# Patient Record
Sex: Female | Born: 1985
Health system: Southern US, Community
[De-identification: ages and names within clinical notes are randomized; demographics above are authoritative.]

## PROBLEM LIST (undated history)

## (undated) DIAGNOSIS — N390 Urinary tract infection, site not specified: Secondary | ICD-10-CM

## (undated) DIAGNOSIS — F410 Panic disorder [episodic paroxysmal anxiety] without agoraphobia: Secondary | ICD-10-CM

## (undated) DIAGNOSIS — N200 Calculus of kidney: Secondary | ICD-10-CM

## (undated) HISTORY — PX: KIDNEY STONE SURGERY: SHX686

---

## 2011-08-27 ENCOUNTER — Emergency Department (INDEPENDENT_AMBULATORY_CARE_PROVIDER_SITE_OTHER): Payer: Self-pay

## 2011-08-27 ENCOUNTER — Emergency Department (HOSPITAL_BASED_OUTPATIENT_CLINIC_OR_DEPARTMENT_OTHER)
Admission: EM | Admit: 2011-08-27 | Discharge: 2011-08-27 | Disposition: A | Discharge status: Home / Self Care | Payer: Self-pay | Attending: Emergency Medicine | Admitting: Emergency Medicine

## 2011-08-27 DIAGNOSIS — M79609 Pain in unspecified limb: Secondary | ICD-10-CM

## 2011-08-27 DIAGNOSIS — M7989 Other specified soft tissue disorders: Secondary | ICD-10-CM

## 2011-08-27 DIAGNOSIS — S60229A Contusion of unspecified hand, initial encounter: Secondary | ICD-10-CM | POA: Insufficient documentation

## 2011-08-27 DIAGNOSIS — X58XXXA Exposure to other specified factors, initial encounter: Secondary | ICD-10-CM

## 2011-08-27 DIAGNOSIS — W230XXA Caught, crushed, jammed, or pinched between moving objects, initial encounter: Secondary | ICD-10-CM | POA: Insufficient documentation

## 2011-08-27 DIAGNOSIS — F17200 Nicotine dependence, unspecified, uncomplicated: Secondary | ICD-10-CM | POA: Insufficient documentation

## 2011-08-27 HISTORY — DX: Calculus of kidney: N20.0

## 2011-08-27 MED ORDER — IBUPROFEN 400 MG PO TABS
600.0000 mg | ORAL_TABLET | Freq: Once | ORAL | Status: AC
  Administered 2011-08-27: 600 mg via ORAL
  Filled 2011-08-27: qty 1

## 2011-08-27 MED ORDER — HYDROCODONE-ACETAMINOPHEN 5-500 MG PO TABS: 1.0000 | ORAL_TABLET | Freq: Four times a day (QID) | ORAL | Status: AC | PRN

## 2011-08-27 MED ORDER — IBUPROFEN 600 MG PO TABS: 600.0000 mg | ORAL_TABLET | Freq: Four times a day (QID) | ORAL | Status: AC | PRN

## 2011-08-27 NOTE — ED Notes (Signed)
Injured right hand in car door 10am

## 2011-08-27 NOTE — ED Provider Notes (Signed)
History     CSN: [id number] Arrival date & time: 08/27/2011  4:28 PM   First MD Initiated Contact with Patient 08/27/11 1632      Chief Complaint  Patient presents with  . Hand Injury     HPI  30 old female previously healthy presents with right hand pain. Patient states that this morning she actually slammed her hand in a car door. She is complaining of pain at the days of the right thumb the thenar eminence. She denies any wrist pain. She denies any numbness tingling or weakness of her fingers. She took ibuprofen earlier today without relief of the pain. She states that the pain is 7/10 at this time. She states that she feels safe at home.    Past Medical History  Diagnosis Date  . Kidney stone     Past Surgical History  Procedure Date  . Kidney stone surgery     No family history on file.  History  Substance Use Topics  . Smoking status: Current Everyday Smoker  . Smokeless tobacco: Not on file  . Alcohol Use:     OB History    Grav Para Term Preterm Abortions TAB SAB Ect Mult Living                  Review of Systems  All other systems reviewed and are negative.    except as noted HPI   Allergies  Review of patient's allergies indicates not on file.  Home Medications   Current Outpatient Rx  Name Route Sig Dispense Refill  . IBUPROFEN 200 MG PO TABS Oral Take 400 mg by mouth every 6 (six) hours as needed. For pain     . HYDROCODONE-ACETAMINOPHEN 5-500 MG PO TABS Oral Take 1-2 tablets by mouth every 6 (six) hours as needed for pain. 15 tablet 0  . IBUPROFEN 600 MG PO TABS Oral Take 1 tablet (600 mg total) by mouth every 6 (six) hours as needed for pain. 30 tablet 0    BP 105/62  Pulse 66  Temp(Src) 98.4 F (36.9 C) (Oral)  Resp 20  Ht 5\' 6"  (1.676 m)  Wt 110 lb (49.896 kg)  BMI 17.75 kg/m2  SpO2 100%  LMP 08/05/2011  Physical Exam  Nursing note and vitals reviewed. Constitutional: She is oriented to person, place, and time. She appears  well-developed.  HENT:  Head: Atraumatic.  Mouth/Throat: Oropharynx is clear and moist.  Eyes: Conjunctivae and EOM are normal. Pupils are equal, round, and reactive to light.  Neck: Normal range of motion. Neck supple.  Cardiovascular: Normal rate, regular rhythm, normal heart sounds and intact distal pulses.   Pulmonary/Chest: Effort normal and breath sounds normal. No respiratory distress. She has no wheezes. She has no rales.  Abdominal: Soft. She exhibits no distension. There is no tenderness. There is no rebound and no guarding.  Musculoskeletal: Normal range of motion.       Right hand with minimal swelling and diffuse tenderness to palpation over thenar eminence there is minimal tenderness to palpation of her MCP of the first digit. Full range of motion without difficulty. Good thumb opposition. Grip strength 5 out of 5. Peyton Najjar refill less than 2 seconds. Gross sensation intact throughout. There is no bony tenderness to palpation of her wrist specifically no snuffbox tenderness.  Neurological: She is alert and oriented to person, place, and time.  Skin: Skin is warm and dry. No rash noted.  Psychiatric: She has a normal mood and affect.  ED Course  Procedures (including critical care time)  Labs Reviewed - No data to display Dg Hand Complete Right  08/27/2011  *RADIOLOGY REPORT*  Clinical Data: Pain and swelling at base of thumb.  RIGHT HAND - COMPLETE 3+ VIEW  Comparison: None.  Findings: No acute osseous or joint abnormality.  IMPRESSION: No acute osseous or joint abnormality.  Original Report Authenticated By: Reyes Ivan, M.D.     1. Hand contusion       MDM  Contusion of hand. Ibuprofen here. Ibuprofen for home, vicodin for breakthrough pain. F/U PMD as needed.  Stefano Gaul, MD         Forbes Cellar, MD 08/27/11 [phone]

## 2011-12-27 ENCOUNTER — Emergency Department (HOSPITAL_BASED_OUTPATIENT_CLINIC_OR_DEPARTMENT_OTHER): Payer: Self-pay

## 2011-12-27 ENCOUNTER — Emergency Department (INDEPENDENT_AMBULATORY_CARE_PROVIDER_SITE_OTHER): Payer: Self-pay

## 2011-12-27 ENCOUNTER — Encounter (HOSPITAL_BASED_OUTPATIENT_CLINIC_OR_DEPARTMENT_OTHER): Payer: Self-pay | Admitting: *Deleted

## 2011-12-27 ENCOUNTER — Emergency Department (HOSPITAL_BASED_OUTPATIENT_CLINIC_OR_DEPARTMENT_OTHER)
Admission: EM | Admit: 2011-12-27 | Discharge: 2011-12-27 | Disposition: A | Discharge status: Home / Self Care | Payer: Self-pay | Attending: Emergency Medicine | Admitting: Emergency Medicine

## 2011-12-27 DIAGNOSIS — R109 Unspecified abdominal pain: Secondary | ICD-10-CM

## 2011-12-27 DIAGNOSIS — N1330 Unspecified hydronephrosis: Secondary | ICD-10-CM

## 2011-12-27 DIAGNOSIS — M549 Dorsalgia, unspecified: Secondary | ICD-10-CM | POA: Insufficient documentation

## 2011-12-27 DIAGNOSIS — N200 Calculus of kidney: Secondary | ICD-10-CM | POA: Insufficient documentation

## 2011-12-27 DIAGNOSIS — R319 Hematuria, unspecified: Secondary | ICD-10-CM | POA: Insufficient documentation

## 2011-12-27 DIAGNOSIS — N201 Calculus of ureter: Secondary | ICD-10-CM

## 2011-12-27 HISTORY — DX: Panic disorder (episodic paroxysmal anxiety): F41.0

## 2011-12-27 LAB — URINALYSIS, ROUTINE W REFLEX MICROSCOPIC
Nitrite: NEGATIVE
Specific Gravity, Urine: 1.018 (ref 1.005–1.030)
pH: 5 (ref 5.0–8.0)

## 2011-12-27 LAB — PREGNANCY, URINE: Preg Test, Ur: NEGATIVE

## 2011-12-27 LAB — URINE MICROSCOPIC-ADD ON

## 2011-12-27 MED ORDER — OXYCODONE-ACETAMINOPHEN 7.5-325 MG PO TABS: 1.0000 | ORAL_TABLET | ORAL | Status: AC | PRN

## 2011-12-27 MED ORDER — KETOROLAC TROMETHAMINE 30 MG/ML IJ SOLN
30.0000 mg | Freq: Once | INTRAMUSCULAR | Status: AC
  Administered 2011-12-27: 30 mg via INTRAVENOUS
  Filled 2011-12-27: qty 1

## 2011-12-27 MED ORDER — IBUPROFEN 600 MG PO TABS: 600.0000 mg | ORAL_TABLET | Freq: Four times a day (QID) | ORAL | Status: AC | PRN

## 2011-12-27 MED ORDER — ONDANSETRON HCL 4 MG/2ML IJ SOLN
4.0000 mg | Freq: Once | INTRAMUSCULAR | Status: AC
  Administered 2011-12-27: 4 mg via INTRAVENOUS
  Filled 2011-12-27: qty 2

## 2011-12-27 NOTE — ED Notes (Signed)
This am had sudden onset of sharp pain in her left flank. Last time she has the same pain she had a kidney stone. Nausea.

## 2011-12-27 NOTE — ED Provider Notes (Signed)
History     CSN: [id number]  Arrival date & time 12/27/11  1515   First MD Initiated Contact with Patient 12/27/11 1551      Chief Complaint  Patient presents with  . Back Pain    (Consider location/radiation/quality/duration/timing/severity/associated sxs/prior treatment) HPI  Patient is 26 yo lady with PMH of left kidney stone and panic attack disorder, who presents with left flanl pain.   Per patient, she started having sudden onset left flank pain at 9:30 AM. It is 9/10 in severity, sharp, non-radiating. It is associated with nausea and vomiting. She vomited twice clear liquid without blood in it.  It is aggravated by standing up or walking, and alleviated by laying still on the bed or ice pack.   Patient reports having hematuria which is brownish and dark red urine.  Her LMP was one week ago.   She denies fever, chills, UTI symptoms, abdominal pain, cough, chest pain, SOB,  diarrhea, constipation, joint pain or leg swelling.   Past Medical History  Diagnosis Date  . Kidney stone   . Panic attacks     Past Surgical History  Procedure Date  . Kidney stone surgery     No family history on file.  History  Substance Use Topics  . Smoking status: Current Everyday Smoker  . Smokeless tobacco: Not on file  . Alcohol Use: Yes    OB History    Grav Para Term Preterm Abortions TAB SAB Ect Mult Living                  Review of Systems  General: no fevers, chills, no changes in body weight, no changes in appetite Skin: no rash HEENT: no blurry vision, hearing changes or sore throat Pulm: no dyspnea, coughing, wheezing CV: no chest pain, palpitations, shortness of breath Abd: no nausea/vomiting, abdominal pain, diarrhea/constipation. Has left flank pain. GU: no dysuria, hematuria, polyuria. Has hematuria. Ext: no arthralgias, myalgias Neuro: no weakness, numbness, or tingling  Allergies  Review of patient's allergies indicates no known allergies.  Home  Medications   Current Outpatient Rx  Name Route Sig Dispense Refill  . MIRTAZAPINE PO Oral Take by mouth.    Marland Kitchen NAPROXEN SODIUM 220 MG PO TABS Oral Take 220 mg by mouth 2 (two) times daily with a meal. Patient used this medication for pain.    . IBUPROFEN 200 MG PO TABS Oral Take 400 mg by mouth every 6 (six) hours as needed. For pain       BP 106/68  Pulse 75  Temp(Src) 98 F (36.7 C) (Oral)  Resp 20  SpO2 100%  Physical Exam  general:  not in acute distress, but in pain. HEENT: PERRL, EOMI, no scleral icterus Cardiac: S1/S2, RRR, No murmurs, gallops or rubs Pulm: Good air movement bilaterally, Clear to auscultation bilaterally, No rales, wheezing, rhonchi or rubs. Abd: Soft,  nondistended, nontender, no rebound pain, no organomegaly, BS present. Positive for left CVA tenderness, and also tender over the left flank area which is at location slightly lower than CVA. Ext: No rashes or edema, 2+DP/PT pulse bilaterally Neuro: alert and oriented X3, cranial nerves II-XII grossly intact, muscle strength 5/5 in all extremeties,  sensation to light touch intact.    ED Course  Procedures (including critical care time)  Patient's Nausea, Vomiting and abdominal pain is most likely caused by kidney stone. Other differential diagnosis includes rapture or twisted ovarian cyst, ectopic pregnancy.  Less likely to be appendicitis (no fever, no  abdominal pain), or diverticulitis (she is young and no abdominal pain).  Patient's pregnancy test is negative. Her CT scan showed 5 mm ureteral calculus. Patient feels better after received Toradol injection. She will be discharged home on percocet and ibuprofen, and is instructed to follow up with urologist.     Labs Reviewed  URINALYSIS, ROUTINE W REFLEX MICROSCOPIC - Abnormal; Notable for the following:    Color, Urine RED (*) BIOCHEMICALS MAY BE AFFECTED BY COLOR   APPearance CLOUDY (*)    Hgb urine dipstick LARGE (*)    Bilirubin Urine MODERATE  (*)    Ketones, ur 15 (*)    Protein, ur 100 (*)    Leukocytes, UA MODERATE (*)    All other components within normal limits  URINE MICROSCOPIC-ADD ON - Abnormal; Notable for the following:    Squamous Epithelial / LPF FEW (*)    Bacteria, UA FEW (*)    All other components within normal limits  PREGNANCY, URINE   No results found.   No diagnosis found.    MDM         Lorretta Harp, MD 12/27/11 Ernestina [place]

## 2011-12-27 NOTE — Discharge Instructions (Signed)
Kidney Stones Kidney stones (ureteral lithiasis) are solid masses that form inside your kidneys. The intense pain is caused by the stone moving through the kidney, ureter, bladder, and urethra (urinary tract). When the stone moves, the ureter starts to spasm around the stone. The stone is usually passed in the urine.  HOME CARE  Drink enough fluids to keep your pee (urine) clear or pale yellow. This helps to get the stone out.   Strain all pee through the provided strainer. Do not pee without peeing through the strainer, not even once. If you pee the stone out, catch it. The stone may be as small as a grain of salt. Take this to your doctor.   Only take medicine as told by your doctor.   Follow up with your doctor as told.   Get follow-up X-rays as told by your doctor.  GET HELP RIGHT AWAY IF:   Your pain does not get better with medicine.   You have a fever.   Your pain increases and gets worse over 18 hours.   You have new belly (abdominal) pain.   You feel faint or pass out.  MAKE SURE YOU:   Understand these instructions.   Will watch your condition.   Will get help right away if you are not doing well or get worse.  Document Released: 03/08/2008 Document Revised: 09/09/2011 Document Reviewed: 07/18/2009 [employer] Patient Information 2012 [employer]. 

## 2011-12-28 NOTE — ED Provider Notes (Signed)
I saw and evaluated the patient, reviewed the resident's note and I agree with the findings and plan.   .Face to face Exam:  General:  Awake HEENT:  Atraumatic Resp:  Normal effort Abd:  Nondistended Neuro:No focal weakness Lymph: No adenopathy   Nelia Shi, MD 12/28/11 1242

## 2012-05-11 ENCOUNTER — Encounter (HOSPITAL_BASED_OUTPATIENT_CLINIC_OR_DEPARTMENT_OTHER): Payer: Self-pay

## 2012-05-11 ENCOUNTER — Emergency Department (HOSPITAL_BASED_OUTPATIENT_CLINIC_OR_DEPARTMENT_OTHER)
Admission: EM | Admit: 2012-05-11 | Discharge: 2012-05-11 | Disposition: A | Discharge status: Home / Self Care | Payer: Self-pay | Attending: Emergency Medicine | Admitting: Emergency Medicine

## 2012-05-11 DIAGNOSIS — N760 Acute vaginitis: Secondary | ICD-10-CM | POA: Insufficient documentation

## 2012-05-11 DIAGNOSIS — A499 Bacterial infection, unspecified: Secondary | ICD-10-CM | POA: Insufficient documentation

## 2012-05-11 DIAGNOSIS — N72 Inflammatory disease of cervix uteri: Secondary | ICD-10-CM | POA: Insufficient documentation

## 2012-05-11 DIAGNOSIS — N390 Urinary tract infection, site not specified: Secondary | ICD-10-CM | POA: Insufficient documentation

## 2012-05-11 DIAGNOSIS — Z87891 Personal history of nicotine dependence: Secondary | ICD-10-CM | POA: Insufficient documentation

## 2012-05-11 DIAGNOSIS — B9689 Other specified bacterial agents as the cause of diseases classified elsewhere: Secondary | ICD-10-CM | POA: Insufficient documentation

## 2012-05-11 LAB — URINE MICROSCOPIC-ADD ON

## 2012-05-11 LAB — URINALYSIS, ROUTINE W REFLEX MICROSCOPIC
Bilirubin Urine: NEGATIVE
Protein, ur: NEGATIVE mg/dL
Specific Gravity, Urine: 1.024 (ref 1.005–1.030)
Urobilinogen, UA: 0.2 mg/dL (ref 0.0–1.0)

## 2012-05-11 LAB — WET PREP, GENITAL

## 2012-05-11 MED ORDER — METRONIDAZOLE 500 MG PO TABS: 500.0000 mg | ORAL_TABLET | Freq: Two times a day (BID) | ORAL | Status: AC

## 2012-05-11 MED ORDER — AZITHROMYCIN 250 MG PO TABS
1000.0000 mg | ORAL_TABLET | Freq: Once | ORAL | Status: AC
  Administered 2012-05-11: 1000 mg via ORAL
  Filled 2012-05-11: qty 4

## 2012-05-11 MED ORDER — CEFTRIAXONE SODIUM 250 MG IJ SOLR
250.0000 mg | Freq: Once | INTRAMUSCULAR | Status: AC
  Administered 2012-05-11: 250 mg via INTRAMUSCULAR
  Filled 2012-05-11: qty 250

## 2012-05-11 MED ORDER — LIDOCAINE HCL (PF) 1 % IJ SOLN
INTRAMUSCULAR | Status: AC
  Administered 2012-05-11: 2.1 mL
  Filled 2012-05-11: qty 5

## 2012-05-11 MED ORDER — CEPHALEXIN 500 MG PO CAPS: 500.0000 mg | ORAL_CAPSULE | Freq: Four times a day (QID) | ORAL | Status: AC

## 2012-05-11 NOTE — ED Notes (Signed)
Dysuria x 2 days.

## 2012-05-11 NOTE — ED Notes (Signed)
assissted with pelvic done by PA C in ED

## 2012-05-11 NOTE — ED Provider Notes (Signed)
History     CSN: [id number]  Arrival date & time 05/11/12  4098   First MD Initiated Contact with Patient 05/11/12 1934      Chief Complaint  Patient presents with  . Dysuria    (Consider location/radiation/quality/duration/timing/severity/associated sxs/prior treatment) Patient is a 26 y.o. female presenting with dysuria. The history is provided by the patient.  Dysuria  This is a new problem. The current episode started 2 days ago. The problem occurs every urination. The problem has been gradually worsening. The quality of the pain is described as burning. The pain is mild. There has been no fever. Associated symptoms include frequency, hesitancy and urgency. Pertinent negatives include no chills, no sweats, no nausea, no vomiting, no discharge, no hematuria, no possible pregnancy and no flank pain. She has tried increased fluids for the symptoms. Her past medical history is significant for kidney stones and recurrent UTIs.  Pt states hx of UTIs, feels the same. Denies flank pain, hematuria, fever, chills, nausea, vomiting. Took cranberry pills and fluids, no relief. No vaginal pain, discharge, bleeding. No abdominal pain.   Past Medical History  Diagnosis Date  . Kidney stone   . Panic attacks     Past Surgical History  Procedure Date  . Kidney stone surgery     No family history on file.  History  Substance Use Topics  . Smoking status: Former Games developer  . Smokeless tobacco: Not on file  . Alcohol Use: Yes    OB History    Grav Para Term Preterm Abortions TAB SAB Ect Mult Living                  Review of Systems  Constitutional: Negative for fever and chills.  Respiratory: Negative.   Cardiovascular: Negative.   Gastrointestinal: Negative for nausea, vomiting and abdominal pain.  Genitourinary: Positive for dysuria, hesitancy, urgency and frequency. Negative for hematuria, flank pain, vaginal bleeding, vaginal discharge and pelvic pain.  Musculoskeletal: Negative  for back pain.  Skin: Negative.   Neurological: Negative for dizziness, numbness and headaches.    Allergies  Review of patient's allergies indicates no known allergies.  Home Medications   Current Outpatient Rx  Name Route Sig Dispense Refill  . IBUPROFEN 200 MG PO TABS Oral Take 400 mg by mouth every 6 (six) hours as needed. For pain     . MIRTAZAPINE PO Oral Take by mouth.    Marland Kitchen NAPROXEN SODIUM 220 MG PO TABS Oral Take 220 mg by mouth 2 (two) times daily with a meal. Patient used this medication for pain.      BP 123/78  Pulse 79  Temp 98.4 F (36.9 C) (Oral)  Resp 16  Ht 5\' 5"  (1.651 m)  Wt 114 lb (51.71 kg)  BMI 18.97 kg/m2  SpO2 100%  LMP 04/17/2012  Physical Exam  Nursing note and vitals reviewed. Constitutional: She is oriented to person, place, and time. She appears well-developed and well-nourished. No distress.  Eyes: Pupils are equal, round, and reactive to light.  Neck: Neck supple.  Cardiovascular: Normal rate, regular rhythm and normal heart sounds.   Pulmonary/Chest: Effort normal and breath sounds normal. No respiratory distress. She has no wheezes. She has no rales.  Abdominal: Soft. Bowel sounds are normal. She exhibits no distension. There is no tenderness. There is no rebound and no guarding.       No CVA tenderness  Genitourinary: Uterus normal. Vaginal discharge found.       White, thick vaginal  discharge. Cervix friable. No CMT, no adnexal tenderness  Neurological: She is alert and oriented to person, place, and time.  Skin: Skin is warm and dry.  Psychiatric: She has a normal mood and affect.    ED Course  Procedures (including critical care time)  Symptoms concerning for UTI, hx of the same. No flank pain, vomiting, hematuria, doubt kidney stone or pyelonephritis. Afebrile. Non toxic. UA pending.   Results for orders placed during the hospital encounter of 05/11/12  URINALYSIS, ROUTINE W REFLEX MICROSCOPIC      Component Value Range    Color, Urine YELLOW  YELLOW   APPearance CLOUDY (*) CLEAR   Specific Gravity, Urine 1.024  1.005 - 1.030   pH 5.5  5.0 - 8.0   Glucose, UA NEGATIVE  NEGATIVE mg/dL   Hgb urine dipstick SMALL (*) NEGATIVE   Bilirubin Urine NEGATIVE  NEGATIVE   Ketones, ur NEGATIVE  NEGATIVE mg/dL   Protein, ur NEGATIVE  NEGATIVE mg/dL   Urobilinogen, UA 0.2  0.0 - 1.0 mg/dL   Nitrite NEGATIVE  NEGATIVE   Leukocytes, UA TRACE (*) NEGATIVE  PREGNANCY, URINE      Component Value Range   Preg Test, Ur NEGATIVE  NEGATIVE  URINE MICROSCOPIC-ADD ON      Component Value Range   Squamous Epithelial / LPF MANY (*) RARE   WBC, UA 3-6  <3 WBC/hpf   RBC / HPF 3-6  <3 RBC/hpf   Bacteria, UA MANY (*) RARE   Urine-Other MUCOUS PRESENT    WET PREP, GENITAL      Component Value Range   Yeast Wet Prep HPF POC NONE SEEN  NONE SEEN   Trich, Wet Prep NONE SEEN  NONE SEEN   Clue Cells Wet Prep HPF POC MANY (*) NONE SEEN   WBC, Wet Prep HPF POC TOO NUMEROUS TO COUNT (*) NONE SEEN    9:19 PM Pt's wet prep showed TNTC WBCs on wet prep, many clue cells. Pt has no uterine tenderness, afebrile, doubt PID. Treated for cervicitis with rocephin 250mg  IM, zithromax 1g PO. Will treat at home for UTI with keflex, bacterial vaginosis with flagyl. Discussed results with pt, possibitlit of STD. Advised to avoid intercourse for a week. Cultures pending. Get partner treated if positive. Pt voiced understanding.    No results found.   1. Cervicitis   2. Bacterial vaginosis   3. UTI (lower urinary tract infection)       MDM          Lottie Mussel, PA 05/11/12 2230

## 2012-05-12 LAB — URINE CULTURE
Colony Count: NO GROWTH
Culture: NO GROWTH

## 2012-05-12 NOTE — ED Provider Notes (Signed)
Medical screening examination/treatment/procedure(s) were performed by non-physician practitioner and as supervising physician I was immediately available for consultation/collaboration.    Vida Roller, MD 05/12/12 [phone]

## 2012-09-19 ENCOUNTER — Encounter (HOSPITAL_BASED_OUTPATIENT_CLINIC_OR_DEPARTMENT_OTHER): Payer: Self-pay | Admitting: *Deleted

## 2012-09-19 ENCOUNTER — Emergency Department (HOSPITAL_BASED_OUTPATIENT_CLINIC_OR_DEPARTMENT_OTHER)
Admission: EM | Admit: 2012-09-19 | Discharge: 2012-09-19 | Disposition: A | Discharge status: Home / Self Care | Payer: Medicaid Other | Attending: Emergency Medicine | Admitting: Emergency Medicine

## 2012-09-19 DIAGNOSIS — F410 Panic disorder [episodic paroxysmal anxiety] without agoraphobia: Secondary | ICD-10-CM | POA: Insufficient documentation

## 2012-09-19 DIAGNOSIS — Z79899 Other long term (current) drug therapy: Secondary | ICD-10-CM | POA: Insufficient documentation

## 2012-09-19 DIAGNOSIS — K047 Periapical abscess without sinus: Secondary | ICD-10-CM | POA: Insufficient documentation

## 2012-09-19 DIAGNOSIS — Z87891 Personal history of nicotine dependence: Secondary | ICD-10-CM | POA: Insufficient documentation

## 2012-09-19 DIAGNOSIS — Z87442 Personal history of urinary calculi: Secondary | ICD-10-CM | POA: Insufficient documentation

## 2012-09-19 MED ORDER — OXYCODONE-ACETAMINOPHEN 5-325 MG PO TABS: 2.0000 | ORAL_TABLET | ORAL | Status: DC | PRN

## 2012-09-19 MED ORDER — PENICILLIN V POTASSIUM 500 MG PO TABS: 500.0000 mg | ORAL_TABLET | Freq: Four times a day (QID) | ORAL | Status: AC

## 2012-09-19 NOTE — ED Notes (Signed)
Patient states she woke up this morning with pain and swelling in her right lower jaw.  Pt has a broken tooth and dental caries.

## 2012-09-19 NOTE — ED Notes (Signed)
Care assumed

## 2012-09-19 NOTE — ED Provider Notes (Signed)
History     CSN: [id number]  Arrival date & time 09/19/12  1250   First MD Initiated Contact with Patient 09/19/12 1317      Chief Complaint  Patient presents with  . Dental Pain    (Consider location/radiation/quality/duration/timing/severity/associated sxs/prior treatment) HPI Comments:  patient presents with right-sided dental pain for the past day. She has a history of a broken tooth but states it only became painful over the past 24 hours.  She denies any fevers, vomiting, difficulty breathing or swallowing. 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  The history is provided by the patient.    Past Medical History  Diagnosis Date  . Kidney stone   . Panic attacks     Past Surgical History  Procedure Date  . Kidney stone surgery     No family history on file.  History  Substance Use Topics  . Smoking status: Former Games developer  . Smokeless tobacco: Not on file  . Alcohol Use: Yes    OB History    Grav Para Term Preterm Abortions TAB SAB Ect Mult Living                  Review of Systems  Unable to perform ROS Constitutional: Negative for fever.  HENT: Positive for dental problem. Negative for sore throat and trouble swallowing.   Respiratory: Negative for cough and shortness of breath.   Gastrointestinal: Negative for nausea, vomiting and abdominal pain.  Genitourinary: Negative for dysuria.  Neurological: Negative for headaches.  Review of Systems  Unable to perform ROS Constitutional: Negative for fever.  HENT: Positive for dental problem. Negative for sore throat and trouble swallowing.   Respiratory: Negative for cough and shortness of breath.   Gastrointestinal: Negative for nausea, vomiting and abdominal pain.  Genitourinary: Negative for dysuria.  Neurological: Negative for headaches.  A complete 10 system review of systems was obtained and all systems are negative except as noted in the HPI and PMH.     Allergies  Review of patient's  allergies indicates no known allergies.  Home Medications   Current Outpatient Rx  Name  Route  Sig  Dispense  Refill  . MIRTAZAPINE 15 MG PO TABS   Oral   Take 15 mg by mouth at bedtime.           BP 130/91  Pulse 70  Temp 98.6 F (37 C) (Oral)  Resp 20  Ht 5\' 6"  (1.676 m)  Wt 112 lb (50.803 kg)  BMI 18.08 kg/m2  SpO2 100%  LMP 09/12/2012  Physical Exam  Constitutional: She is oriented to person, place, and time. She appears well-developed and well-nourished. No distress.       Mild R sided jaw swelling  HENT:  Head: Normocephalic.  Mouth/Throat: Oropharynx is clear and moist.    Eyes: Conjunctivae normal and EOM are normal. Pupils are equal, round, and reactive to light.  Neck: Normal range of motion. Neck supple.       No meningismus  Cardiovascular: Normal rate, regular rhythm and normal heart sounds.   No murmur heard. Pulmonary/Chest: Effort normal and breath sounds normal. No respiratory distress.  Abdominal: Soft. There is no tenderness. There is no rebound and no guarding.  Musculoskeletal: Normal range of motion. She exhibits no edema and no tenderness.  Neurological: She is alert and oriented to person, place, and time. No cranial nerve deficit. She exhibits normal muscle tone. Coordination normal.  Skin: Skin is warm.  ED Course  Procedures (including critical care time)  Labs Reviewed - No data to display No results found.   No diagnosis found.    MDM  Early dental abscess. No evidence of Ludwig angina. Vital stable, no distress, no difficulty breathing or swallowing.  Patient instructed on warm compresses, dental followup, will prescribe antibiotics and pain medication. Return precautions discussed.        Glynn Octave, MD 09/19/12 [phone]

## 2013-03-21 ENCOUNTER — Encounter (HOSPITAL_BASED_OUTPATIENT_CLINIC_OR_DEPARTMENT_OTHER): Payer: Self-pay

## 2013-03-21 ENCOUNTER — Emergency Department (HOSPITAL_BASED_OUTPATIENT_CLINIC_OR_DEPARTMENT_OTHER)
Admission: EM | Admit: 2013-03-21 | Discharge: 2013-03-21 | Disposition: A | Discharge status: Home / Self Care | Payer: Medicaid Other | Attending: Emergency Medicine | Admitting: Emergency Medicine

## 2013-03-21 ENCOUNTER — Emergency Department (HOSPITAL_BASED_OUTPATIENT_CLINIC_OR_DEPARTMENT_OTHER): Payer: Medicaid Other

## 2013-03-21 DIAGNOSIS — Z87442 Personal history of urinary calculi: Secondary | ICD-10-CM | POA: Insufficient documentation

## 2013-03-21 DIAGNOSIS — A499 Bacterial infection, unspecified: Secondary | ICD-10-CM | POA: Insufficient documentation

## 2013-03-21 DIAGNOSIS — R1031 Right lower quadrant pain: Secondary | ICD-10-CM | POA: Insufficient documentation

## 2013-03-21 DIAGNOSIS — Z79899 Other long term (current) drug therapy: Secondary | ICD-10-CM | POA: Insufficient documentation

## 2013-03-21 DIAGNOSIS — N760 Acute vaginitis: Secondary | ICD-10-CM

## 2013-03-21 DIAGNOSIS — B9689 Other specified bacterial agents as the cause of diseases classified elsewhere: Secondary | ICD-10-CM | POA: Insufficient documentation

## 2013-03-21 DIAGNOSIS — Z3202 Encounter for pregnancy test, result negative: Secondary | ICD-10-CM | POA: Insufficient documentation

## 2013-03-21 DIAGNOSIS — R109 Unspecified abdominal pain: Secondary | ICD-10-CM

## 2013-03-21 DIAGNOSIS — R110 Nausea: Secondary | ICD-10-CM | POA: Insufficient documentation

## 2013-03-21 DIAGNOSIS — F410 Panic disorder [episodic paroxysmal anxiety] without agoraphobia: Secondary | ICD-10-CM | POA: Insufficient documentation

## 2013-03-21 LAB — BASIC METABOLIC PANEL
Calcium: 10.1 mg/dL (ref 8.4–10.5)
Chloride: 101 mEq/L (ref 96–112)
Creatinine, Ser: 0.8 mg/dL (ref 0.50–1.10)
GFR calc Af Amer: >90 mL/min (ref >90)
Sodium: 136 mEq/L (ref 135–145)

## 2013-03-21 LAB — CBC WITH DIFFERENTIAL/PLATELET
Basophils Absolute: 0 10*3/uL (ref 0.0–0.1)
Basophils Relative: 0 % (ref 0–1)
HCT: 39.2 % (ref 36.0–46.0)
Lymphocytes Relative: 11 % — ABNORMAL LOW (ref 12–46)
MCHC: 33.4 g/dL (ref 30.0–36.0)
Monocytes Absolute: 1 10*3/uL (ref 0.1–1.0)
Neutro Abs: 11.1 10*3/uL — ABNORMAL HIGH (ref 1.7–7.7)
Platelets: 253 10*3/uL (ref 150–400)
RDW: 13.2 % (ref 11.5–15.5)
WBC: 13.9 10*3/uL — ABNORMAL HIGH (ref 4.0–10.5)

## 2013-03-21 LAB — URINALYSIS, ROUTINE W REFLEX MICROSCOPIC
Bilirubin Urine: NEGATIVE
Hgb urine dipstick: NEGATIVE
Ketones, ur: NEGATIVE mg/dL
Protein, ur: NEGATIVE mg/dL
Urobilinogen, UA: 1 mg/dL (ref 0.0–1.0)

## 2013-03-21 LAB — PREGNANCY, URINE: Preg Test, Ur: NEGATIVE

## 2013-03-21 LAB — WET PREP, GENITAL
Trich, Wet Prep: NONE SEEN
Yeast Wet Prep HPF POC: NONE SEEN

## 2013-03-21 MED ORDER — IOHEXOL 300 MG/ML  SOLN
100.0000 mL | Freq: Once | INTRAMUSCULAR | Status: AC | PRN
  Administered 2013-03-21: 100 mL via INTRAVENOUS

## 2013-03-21 MED ORDER — MORPHINE SULFATE 4 MG/ML IJ SOLN
4.0000 mg | Freq: Once | INTRAMUSCULAR | Status: DC
  Filled 2013-03-21: qty 1

## 2013-03-21 MED ORDER — IOHEXOL 300 MG/ML  SOLN
50.0000 mL | Freq: Once | INTRAMUSCULAR | Status: AC | PRN
  Administered 2013-03-21: 50 mL via ORAL

## 2013-03-21 MED ORDER — METRONIDAZOLE 500 MG PO TABS: 500.0000 mg | ORAL_TABLET | Freq: Two times a day (BID) | ORAL | Status: DC

## 2013-03-21 MED ORDER — HYDROCODONE-ACETAMINOPHEN 5-325 MG PO TABS: 2.0000 | ORAL_TABLET | ORAL | Status: DC | PRN

## 2013-03-21 MED ORDER — ONDANSETRON HCL 4 MG/2ML IJ SOLN
4.0000 mg | Freq: Once | INTRAMUSCULAR | Status: AC
  Administered 2013-03-21: 4 mg via INTRAVENOUS
  Filled 2013-03-21: qty 2

## 2013-03-21 NOTE — ED Provider Notes (Signed)
Medical screening examination/treatment/procedure(s) were performed by non-physician practitioner and as supervising physician I was immediately available for consultation/collaboration.  Devinn Voshell, MD 03/21/13 2056 

## 2013-03-21 NOTE — ED Provider Notes (Signed)
History     CSN: [id number]  Arrival date & time 03/21/13  1344   First MD Initiated Contact with Patient 03/21/13 1424      Chief Complaint  Patient presents with  . Abdominal Pain    (Consider location/radiation/quality/duration/timing/severity/associated sxs/prior treatment) Patient is a 27 y.o. female presenting with abdominal pain. The history is provided by the patient. No language interpreter was used.  Abdominal Pain This is a new problem. The current episode started in the past 7 days. The problem occurs constantly. The problem has been unchanged. Associated symptoms include abdominal pain and nausea. Pertinent negatives include no fever, urinary symptoms or vomiting. Nothing aggravates the symptoms. She has tried nothing for the symptoms.    Past Medical History  Diagnosis Date  . Kidney stone   . Panic attacks     Past Surgical History  Procedure Laterality Date  . Kidney stone surgery      History reviewed. No pertinent family history.  History  Substance Use Topics  . Smoking status: Former Games developer  . Smokeless tobacco: Not on file  . Alcohol Use: Yes    OB History   Grav Para Term Preterm Abortions TAB SAB Ect Mult Living                  Review of Systems  Constitutional: Negative for fever.  Respiratory: Negative.   Cardiovascular: Negative.   Gastrointestinal: Positive for nausea and abdominal pain. Negative for vomiting.    Allergies  Review of patient's allergies indicates no known allergies.  Home Medications   Current Outpatient Rx  Name  Route  Sig  Dispense  Refill  . acetaminophen (TYLENOL) 500 MG tablet   Oral   Take 1,000 mg by mouth every 6 (six) hours as needed for pain.         Marland Kitchen acetaminophen-codeine (TYLENOL #3) 300-30 MG per tablet   Oral   Take 1 tablet by mouth every 4 (four) hours as needed for pain.         . mirtazapine (REMERON) 15 MG tablet   Oral   Take 15 mg by mouth at bedtime.         Marland Kitchen  oxyCODONE-acetaminophen (PERCOCET/ROXICET) 5-325 MG per tablet   Oral   Take 2 tablets by mouth every 4 (four) hours as needed for pain.   15 tablet   0     BP 117/74  Pulse 74  Temp(Src) 98.6 F (37 C) (Oral)  Resp 16  Ht 5\' 5"  (1.651 m)  Wt 115 lb (52.164 kg)  BMI 19.14 kg/m2  SpO2 100%  LMP 03/07/2013  Physical Exam  Nursing note and vitals reviewed. Constitutional: She is oriented to person, place, and time. She appears well-developed and well-nourished.  HENT:  Head: Normocephalic and atraumatic.  Eyes: Conjunctivae and EOM are normal.  Neck: Normal range of motion. Neck supple.  Cardiovascular: Normal rate and regular rhythm.   Pulmonary/Chest: Effort normal and breath sounds normal.  Abdominal: Soft. Bowel sounds are normal. There is tenderness in the right lower quadrant.  Genitourinary:  reddish brown vaginal discharge:-cmt tenderness  Musculoskeletal: Normal range of motion.  Neurological: She is alert and oriented to person, place, and time.  Skin: Skin is warm and dry.  Psychiatric: She has a normal mood and affect.    ED Course  Procedures (including critical care time)  Labs Reviewed  WET PREP, GENITAL - Abnormal; Notable for the following:    Clue Cells Wet Prep HPF  POC MODERATE (*)    WBC, Wet Prep HPF POC FEW (*)    All other components within normal limits  URINALYSIS, ROUTINE W REFLEX MICROSCOPIC - Abnormal; Notable for the following:    APPearance CLOUDY (*)    All other components within normal limits  CBC WITH DIFFERENTIAL - Abnormal; Notable for the following:    WBC 13.9 (*)    Neutrophils Relative % 80 (*)    Neutro Abs 11.1 (*)    Lymphocytes Relative 11 (*)    All other components within normal limits  GC/CHLAMYDIA PROBE AMP  PREGNANCY, URINE  BASIC METABOLIC PANEL   Ct Abdomen Pelvis W Contrast  03/21/2013   *RADIOLOGY REPORT*  Clinical Data: Right lower quadrant pain.  CT ABDOMEN AND PELVIS WITH CONTRAST  Technique:   Multidetector CT imaging of the abdomen and pelvis was performed following the standard protocol during bolus administration of intravenous contrast.  Contrast: OMNIPAQUE IOHEXOL 300 MG/ML  SOLN  Comparison: 12/27/2011  Findings: Small low-density lesions are noted in the liver compatible with cysts, stable.  Spleen, pancreas, adrenals and kidneys are unremarkable.  No renal or ureteral stones.  No hydronephrosis.  Appendix visualized and is normal.  There is a small to moderate free fluid in the cul-de-sac of the pelvis.  Uterus and adnexa grossly unremarkable.  Urinary bladder is decompressed.  Small bowel is decompressed.  No free air or adenopathy.  Aorta is normal caliber.  Lung bases are clear.  No effusions.  Heart is normal size.  No acute bony abnormality.  IMPRESSION: Small to moderate free fluid in the pelvis.  Normal appendix.  Stable hepatic cysts.   Original Report Authenticated By: Charlett Nose, M.D.     1. BV (bacterial vaginosis)   2. Abdominal pain       MDM  Will treat for bv:pt is comfortable at this time:will send home with hydrocodone       Teressa Lower, NP 03/21/13 1739

## 2013-03-21 NOTE — ED Notes (Signed)
Pt states that she has severe pain in R LQ of abd, c/o rectal bleeding when wiping, and states that she has intermittent, n/v/d, c/o vag discharge.

## 2013-06-18 ENCOUNTER — Emergency Department (HOSPITAL_BASED_OUTPATIENT_CLINIC_OR_DEPARTMENT_OTHER)
Admission: EM | Admit: 2013-06-18 | Discharge: 2013-06-18 | Disposition: A | Discharge status: Home / Self Care | Payer: Medicaid Other | Attending: Emergency Medicine | Admitting: Emergency Medicine

## 2013-06-18 ENCOUNTER — Encounter (HOSPITAL_BASED_OUTPATIENT_CLINIC_OR_DEPARTMENT_OTHER): Payer: Self-pay | Admitting: *Deleted

## 2013-06-18 DIAGNOSIS — Z87442 Personal history of urinary calculi: Secondary | ICD-10-CM | POA: Insufficient documentation

## 2013-06-18 DIAGNOSIS — Z8659 Personal history of other mental and behavioral disorders: Secondary | ICD-10-CM | POA: Insufficient documentation

## 2013-06-18 DIAGNOSIS — R300 Dysuria: Secondary | ICD-10-CM | POA: Insufficient documentation

## 2013-06-18 DIAGNOSIS — J069 Acute upper respiratory infection, unspecified: Secondary | ICD-10-CM | POA: Insufficient documentation

## 2013-06-18 DIAGNOSIS — R350 Frequency of micturition: Secondary | ICD-10-CM | POA: Insufficient documentation

## 2013-06-18 DIAGNOSIS — O9989 Other specified diseases and conditions complicating pregnancy, childbirth and the puerperium: Secondary | ICD-10-CM | POA: Insufficient documentation

## 2013-06-18 DIAGNOSIS — R079 Chest pain, unspecified: Secondary | ICD-10-CM | POA: Insufficient documentation

## 2013-06-18 DIAGNOSIS — Z87891 Personal history of nicotine dependence: Secondary | ICD-10-CM | POA: Insufficient documentation

## 2013-06-18 DIAGNOSIS — Z79899 Other long term (current) drug therapy: Secondary | ICD-10-CM | POA: Insufficient documentation

## 2013-06-18 LAB — URINALYSIS, ROUTINE W REFLEX MICROSCOPIC
Bilirubin Urine: NEGATIVE
Glucose, UA: NEGATIVE mg/dL
Hgb urine dipstick: NEGATIVE
Specific Gravity, Urine: 1.017 (ref 1.005–1.030)

## 2013-06-18 LAB — URINE MICROSCOPIC-ADD ON

## 2013-06-18 LAB — PREGNANCY, URINE: Preg Test, Ur: POSITIVE — AB

## 2013-06-18 NOTE — ED Provider Notes (Signed)
CSN: [id number]     Arrival date & time 06/18/13  1143 History   First MD Initiated Contact with Patient 06/18/13 1225     Chief Complaint  Patient presents with  . Cough   (Consider location/radiation/quality/duration/timing/severity/associated sxs/prior Treatment) HPI Comments: Pt states that she is [redacted] weeks pregnant, has had cough, congestion and sore throat for the last 3 days:denies fever,or vomiting:pt states that she has taken some tylenol:pt states that she is also having some frequency with urination which is new:pt has had prenatal care with Dr. Dorn:denies vomiting or diarrhea:pt states that she started having pain with coughing this morning  The history is provided by the patient. No language interpreter was used.    Past Medical History  Diagnosis Date  . Kidney stone   . Panic attacks    Past Surgical History  Procedure Laterality Date  . Kidney stone surgery     No family history on file. History  Substance Use Topics  . Smoking status: Former Games developer  . Smokeless tobacco: Not on file  . Alcohol Use: No   OB History   Grav Para Term Preterm Abortions TAB SAB Ect Mult Living   1              Review of Systems  Constitutional: Negative.   Respiratory: Positive for cough.   Cardiovascular: Positive for chest pain.    Allergies  Review of patient's allergies indicates no known allergies.  Home Medications   Current Outpatient Rx  Name  Route  Sig  Dispense  Refill  . Cyanocobalamin (VITAMIN B 12 PO)   Oral   Take by mouth.         . Prenatal Vit-Fe Fumarate-FA (PRENATAL MULTIVITAMIN) TABS tablet   Oral   Take 1 tablet by mouth daily at 12 noon.         Marland Kitchen acetaminophen (TYLENOL) 500 MG tablet   Oral   Take 1,000 mg by mouth every 6 (six) hours as needed for pain.         Marland Kitchen acetaminophen-codeine (TYLENOL #3) 300-30 MG per tablet   Oral   Take 1 tablet by mouth every 4 (four) hours as needed for pain.         Marland Kitchen HYDROcodone-acetaminophen  (NORCO/VICODIN) 5-325 MG per tablet   Oral   Take 2 tablets by mouth every 4 (four) hours as needed for pain.   10 tablet   0   . metroNIDAZOLE (FLAGYL) 500 MG tablet   Oral   Take 1 tablet (500 mg total) by mouth 2 (two) times daily.   14 tablet   0   . mirtazapine (REMERON) 15 MG tablet   Oral   Take 15 mg by mouth at bedtime.         Marland Kitchen oxyCODONE-acetaminophen (PERCOCET/ROXICET) 5-325 MG per tablet   Oral   Take 2 tablets by mouth every 4 (four) hours as needed for pain.   15 tablet   0    BP 102/58  Temp(Src) 98.9 F (37.2 C) (Oral)  Resp 14  Ht 5\' 6"  (1.676 m)  Wt 107 lb (48.535 kg)  BMI 17.28 kg/m2  SpO2 100%  LMP 03/07/2013 Physical Exam  Nursing note and vitals reviewed. Constitutional: She is oriented to person, place, and time. She appears well-developed and well-nourished.  HENT:  Head: Normocephalic and atraumatic.  Right Ear: External ear normal.  Left Ear: External ear normal.  Mouth/Throat: Posterior oropharyngeal erythema present.  Eyes: Conjunctivae and EOM  are normal.  Cardiovascular: Normal rate and regular rhythm.   Pulmonary/Chest: Effort normal and breath sounds normal.  Abdominal: Soft. Bowel sounds are normal. There is no tenderness.  Musculoskeletal: Normal range of motion.  Neurological: She is alert and oriented to person, place, and time.  Skin: Skin is warm and dry.    ED Course  Procedures (including critical care time) Labs Review Labs Reviewed  URINALYSIS, ROUTINE W REFLEX MICROSCOPIC - Abnormal; Notable for the following:    APPearance TURBID (*)    Leukocytes, UA TRACE (*)    All other components within normal limits  PREGNANCY, URINE - Abnormal; Notable for the following:    Preg Test, Ur POSITIVE (*)    All other components within normal limits  URINE MICROSCOPIC-ADD ON - Abnormal; Notable for the following:    Bacteria, UA FEW (*)    Casts GRANULAR CAST (*)    All other components within normal limits  URINE CULTURE    Imaging Review No results found.  MDM   1. URI (upper respiratory infection)   2. Dysuria    dont think antibiotics are needed at this time:clinically pt doesn't have pneumonia at this time    Teressa Lower, NP 06/18/13 1427

## 2013-06-18 NOTE — ED Provider Notes (Signed)
Medical screening examination/treatment/procedure(s) were performed by non-physician practitioner and as supervising physician I was immediately available for consultation/collaboration.  Doug Sou, MD 06/18/13 1511

## 2013-06-18 NOTE — ED Notes (Signed)
Patient states she has had sinus congestion, chest congestion, sore throat and productive cough with clear secretions.  States she took tylenol this morning and had chest pain.

## 2013-06-19 LAB — URINE CULTURE
Colony Count: NO GROWTH
Culture: NO GROWTH

## 2014-07-02 IMAGING — CT CT ABD-PELV W/ CM
2 of 4 series · 17 of 46 positions shown, 19 images · IV contrast (APPLIED)
Comparison: 12/27/2011

CLINICAL DATA: Right lower quadrant pain.

CT ABDOMEN AND PELVIS WITH CONTRAST
TECHNIQUE: Multidetector CT imaging of the abdomen and pelvis was
performed following the standard protocol during bolus
administration of intravenous contrast.
Contrast: 100mL OMNIPAQUE IOHEXOL 300 MG/ML  SOLN

[Series 2: abd/pelvis 5.0 b31f · axial · 0.61mm/px · z∈[+824,+1190]mm · 14 of 81 slices shown, 16 images]
[im 4/81  soft-tissue]
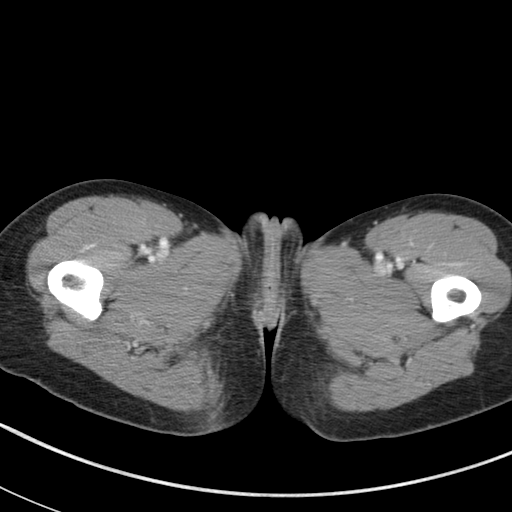
[im 4/81  bone]
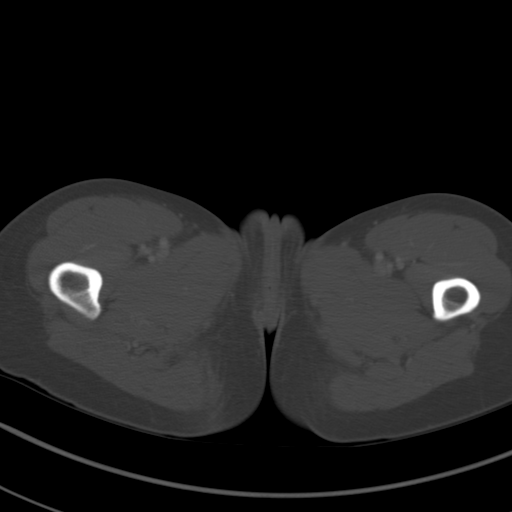
[im 10/81  soft-tissue]
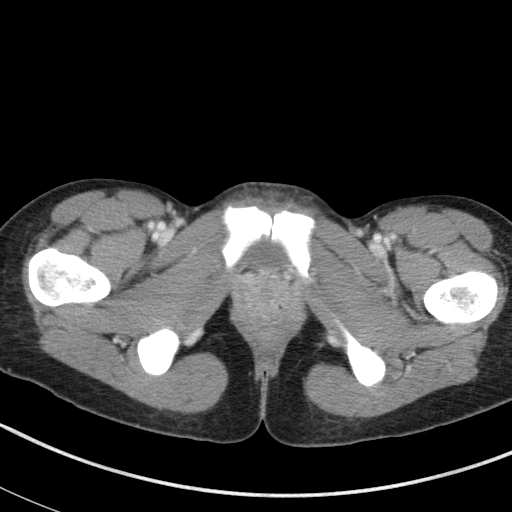
[im 17/81  soft-tissue]
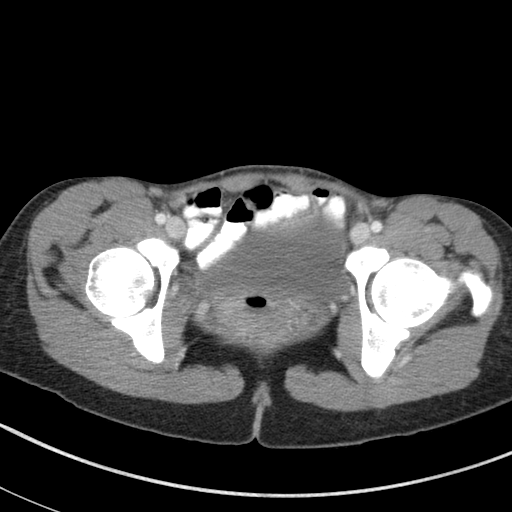
[im 23/81  soft-tissue]
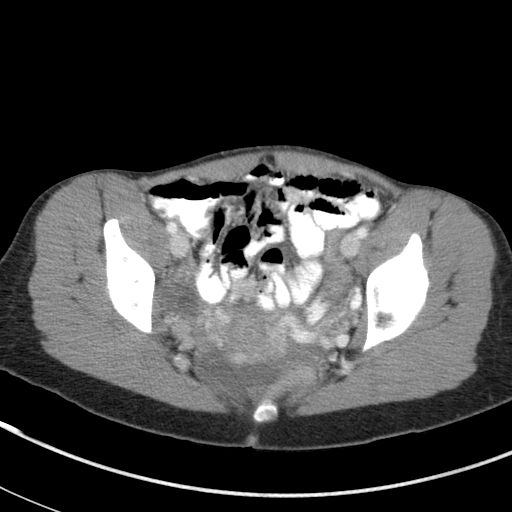
[im 26/81  soft-tissue]
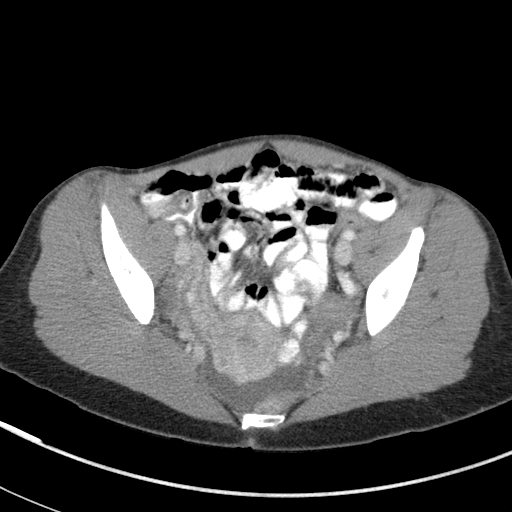
[im 33/81  soft-tissue]
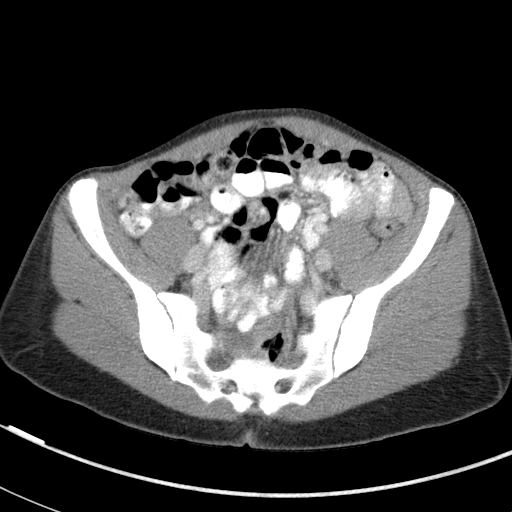
[im 39/81  soft-tissue]
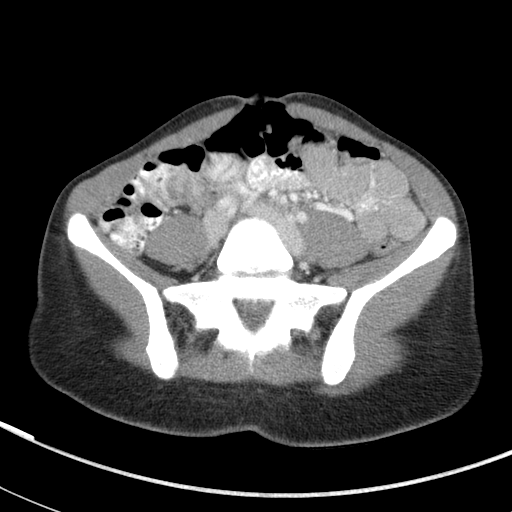
[im 42/81  soft-tissue]
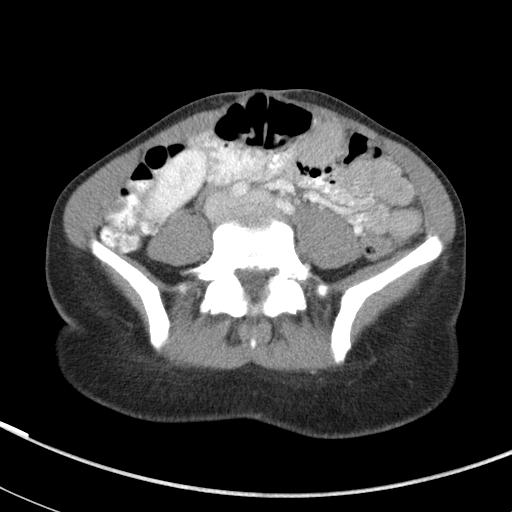
[im 49/81  soft-tissue]
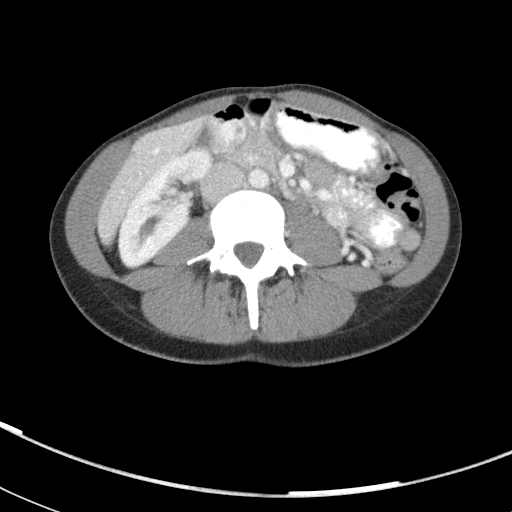
[im 49/81  bone]
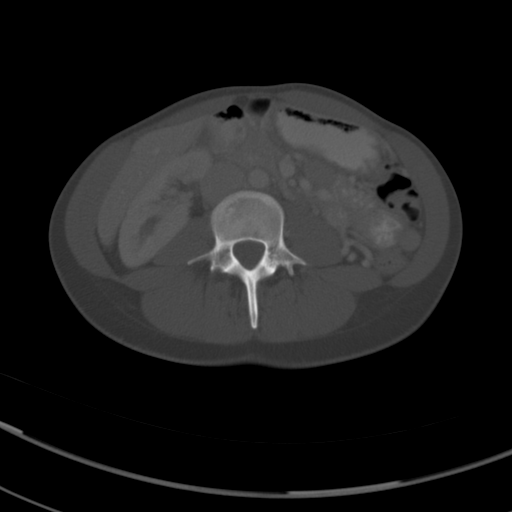
[im 55/81  soft-tissue]
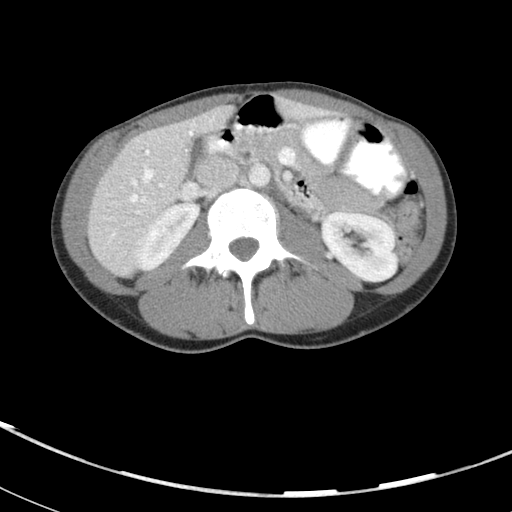
[im 61/81  soft-tissue]
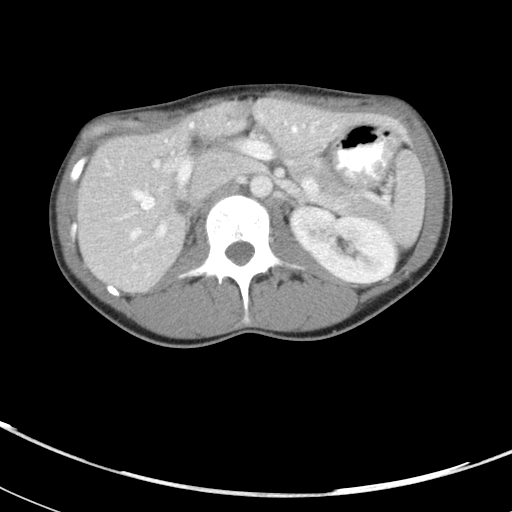
[im 65/81  soft-tissue]
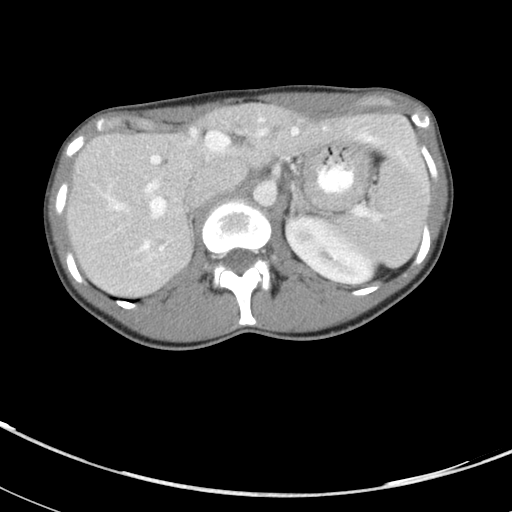
[im 71/81  soft-tissue]
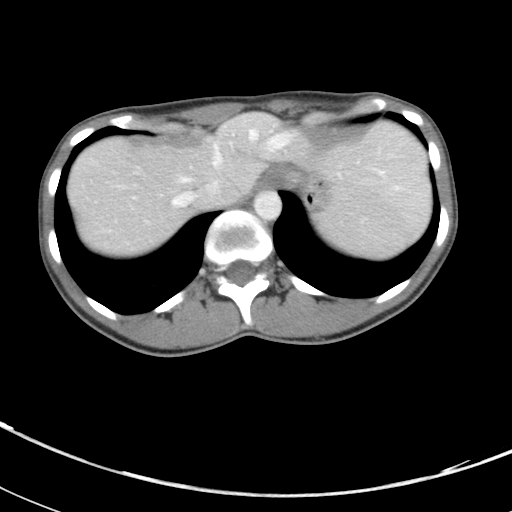
[im 77/81  soft-tissue]
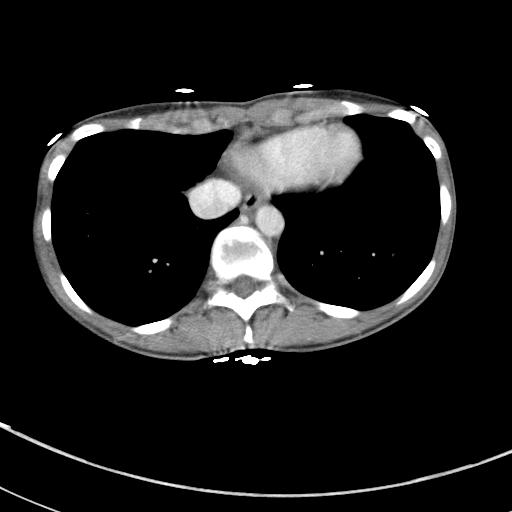

[Series 5: abd/pelvis 3.0 coronal · coronal · 0.62mm/px · 3 of 71 slices shown]
[im 24/71  soft-tissue]
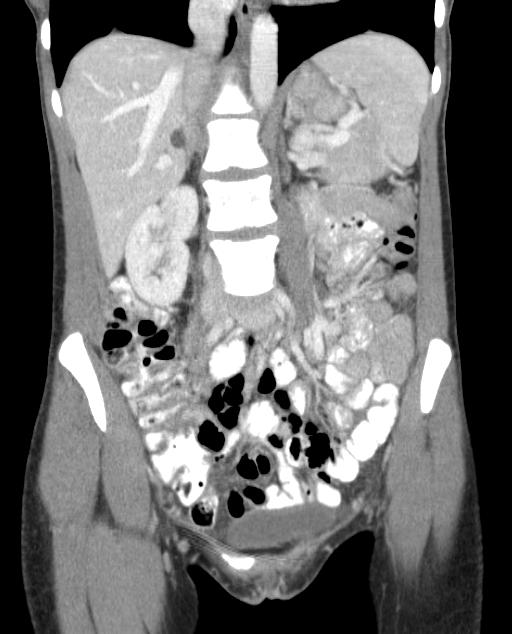
[im 32/71  soft-tissue]
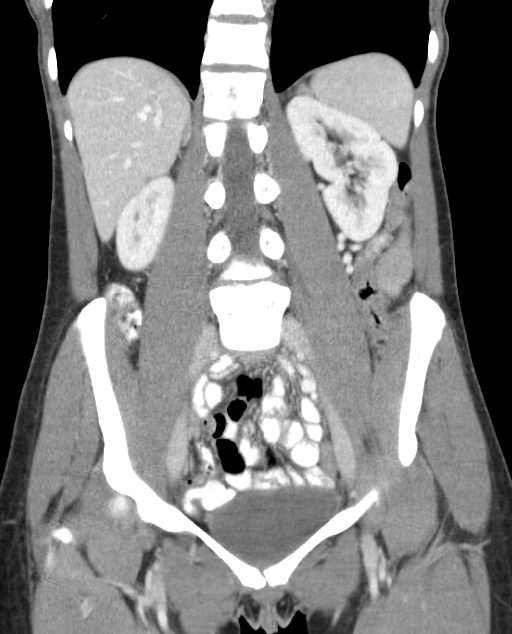
[im 39/71  soft-tissue]
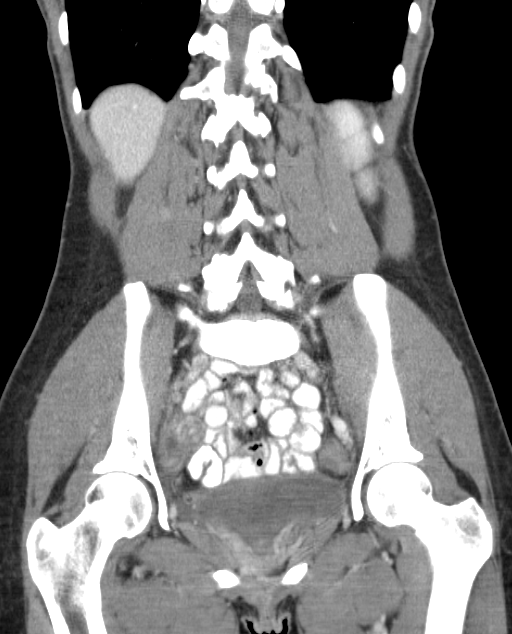

[17 of 46 positions shown; findings below may reference images not displayed]

FINDINGS: Small low-density lesions are noted in the liver
compatible with cysts, stable.  Spleen, pancreas, adrenals and
kidneys are unremarkable.  No renal or ureteral stones.  No
hydronephrosis.

Appendix visualized and is normal.  There is a small to moderate
free fluid in the cul-de-sac of the pelvis.  Uterus and adnexa
grossly unremarkable.  Urinary bladder is decompressed.  Small
bowel is decompressed.  No free air or adenopathy.  Aorta is normal
caliber.

Lung bases are clear.  No effusions.  Heart is normal size.

No acute bony abnormality.
IMPRESSION: Small to moderate free fluid in the pelvis.

Normal appendix.

Stable hepatic cysts.

## 2014-08-05 ENCOUNTER — Encounter (HOSPITAL_BASED_OUTPATIENT_CLINIC_OR_DEPARTMENT_OTHER): Payer: Self-pay | Admitting: *Deleted

## 2014-09-12 ENCOUNTER — Encounter (HOSPITAL_BASED_OUTPATIENT_CLINIC_OR_DEPARTMENT_OTHER): Payer: Self-pay | Admitting: *Deleted

## 2014-09-12 ENCOUNTER — Emergency Department (HOSPITAL_BASED_OUTPATIENT_CLINIC_OR_DEPARTMENT_OTHER)
Admission: EM | Admit: 2014-09-12 | Discharge: 2014-09-12 | Disposition: A | Discharge status: Home / Self Care | Payer: Medicaid Other | Attending: Emergency Medicine | Admitting: Emergency Medicine

## 2014-09-12 DIAGNOSIS — Z8659 Personal history of other mental and behavioral disorders: Secondary | ICD-10-CM | POA: Insufficient documentation

## 2014-09-12 DIAGNOSIS — Z87442 Personal history of urinary calculi: Secondary | ICD-10-CM | POA: Insufficient documentation

## 2014-09-12 DIAGNOSIS — Z3202 Encounter for pregnancy test, result negative: Secondary | ICD-10-CM | POA: Insufficient documentation

## 2014-09-12 DIAGNOSIS — Z79899 Other long term (current) drug therapy: Secondary | ICD-10-CM | POA: Insufficient documentation

## 2014-09-12 DIAGNOSIS — N390 Urinary tract infection, site not specified: Secondary | ICD-10-CM | POA: Insufficient documentation

## 2014-09-12 DIAGNOSIS — Z720 Tobacco use: Secondary | ICD-10-CM | POA: Insufficient documentation

## 2014-09-12 DIAGNOSIS — Z792 Long term (current) use of antibiotics: Secondary | ICD-10-CM | POA: Insufficient documentation

## 2014-09-12 LAB — URINALYSIS, ROUTINE W REFLEX MICROSCOPIC
Bilirubin Urine: NEGATIVE
Glucose, UA: NEGATIVE mg/dL
Ketones, ur: NEGATIVE mg/dL
NITRITE: NEGATIVE
PROTEIN: NEGATIVE mg/dL
Specific Gravity, Urine: 1.024 (ref 1.005–1.030)
UROBILINOGEN UA: 1 mg/dL (ref 0.0–1.0)
pH: 5.5 (ref 5.0–8.0)

## 2014-09-12 LAB — URINE MICROSCOPIC-ADD ON

## 2014-09-12 LAB — PREGNANCY, URINE: PREG TEST UR: NEGATIVE

## 2014-09-12 MED ORDER — CEPHALEXIN 500 MG PO CAPS: 500.0000 mg | ORAL_CAPSULE | Freq: Four times a day (QID) | ORAL | Status: DC

## 2014-09-12 MED ORDER — CEPHALEXIN 250 MG PO CAPS
500.0000 mg | ORAL_CAPSULE | Freq: Once | ORAL | Status: AC
  Administered 2014-09-12: 500 mg via ORAL
  Filled 2014-09-12: qty 2

## 2014-09-12 NOTE — ED Notes (Signed)
C/O Dysuria and frequency

## 2014-09-12 NOTE — ED Provider Notes (Signed)
CSN: [id number]     Arrival date & time 09/12/14  1940 History   First MD Initiated Contact with Patient 09/12/14 2032     Chief Complaint  Patient presents with  . Dysuria     (Consider location/radiation/quality/duration/timing/severity/associated sxs/prior Treatment) Patient is a 28 y.o. female presenting with dysuria. The history is provided by the patient. No language interpreter was used.  Dysuria Pain quality:  Burning Pain severity:  Moderate Onset quality:  Gradual Duration:  5 days Timing:  Intermittent Progression:  Unchanged Chronicity:  New Recent urinary tract infections: no   Relieved by:  Nothing Worsened by:  Nothing tried Ineffective treatments:  Cranberry juice Urinary symptoms: frequent urination   Urinary symptoms: no discolored urine, no foul-smelling urine, no hematuria, no hesitancy and no bladder incontinence   Associated symptoms: no abdominal pain, no fever, no nausea and no vomiting   Risk factors: no hx of pyelonephritis, no hx of urolithiasis, no kidney transplant, not pregnant, no renal cysts, no renal disease, not sexually active, not single kidney, no sexually transmitted infections and no urinary catheter     Past Medical History  Diagnosis Date  . Kidney stone   . Panic attacks    Past Surgical History  Procedure Laterality Date  . Kidney stone surgery     No family history on file. History  Substance Use Topics  . Smoking status: Current Every Day Smoker    Types: Cigarettes  . Smokeless tobacco: Never Used  . Alcohol Use: No   OB History    Gravida Para Term Preterm AB TAB SAB Ectopic Multiple Living   1              Review of Systems  Constitutional: Negative for fever, chills and fatigue.  HENT: Negative for trouble swallowing.   Eyes: Negative for visual disturbance.  Respiratory: Negative for shortness of breath.   Cardiovascular: Negative for chest pain and palpitations.  Gastrointestinal: Negative for nausea,  vomiting, abdominal pain and diarrhea.  Genitourinary: Positive for dysuria and frequency. Negative for difficulty urinating.  Musculoskeletal: Negative for arthralgias and neck pain.  Skin: Negative for color change.  Neurological: Negative for dizziness and weakness.  Psychiatric/Behavioral: Negative for dysphoric mood.      Allergies  Review of patient's allergies indicates no known allergies.  Home Medications   Prior to Admission medications   Medication Sig Start Date End Date Taking? Authorizing Provider  acetaminophen (TYLENOL) 500 MG tablet Take 1,000 mg by mouth every 6 (six) hours as needed for pain.    Historical Provider, MD  acetaminophen-codeine (TYLENOL #3) 300-30 MG per tablet Take 1 tablet by mouth every 4 (four) hours as needed for pain.    Historical Provider, MD  Cyanocobalamin (VITAMIN B 12 PO) Take by mouth.    Historical Provider, MD  HYDROcodone-acetaminophen (NORCO/VICODIN) 5-325 MG per tablet Take 2 tablets by mouth every 4 (four) hours as needed for pain. 03/21/13   Teressa LowerVrinda Pickering, NP  metroNIDAZOLE (FLAGYL) 500 MG tablet Take 1 tablet (500 mg total) by mouth 2 (two) times daily. 03/21/13   Teressa LowerVrinda Pickering, NP  mirtazapine (REMERON) 15 MG tablet Take 15 mg by mouth at bedtime.    Historical Provider, MD  oxyCODONE-acetaminophen (PERCOCET/ROXICET) 5-325 MG per tablet Take 2 tablets by mouth every 4 (four) hours as needed for pain. 09/19/12   Glynn OctaveStephen Rancour, MD  Prenatal Vit-Fe Fumarate-FA (PRENATAL MULTIVITAMIN) TABS tablet Take 1 tablet by mouth daily at 12 noon.    Historical Provider,  MD   BP 105/61 mmHg  Pulse 70  Temp(Src) 98.5 F (36.9 C) (Oral)  Resp 18  Ht 5\' 5"  (1.651 m)  Wt 110 lb (49.896 kg)  BMI 18.31 kg/m2  SpO2 100%  LMP 08/26/2014  Breastfeeding? Unknown Physical Exam  Constitutional: She is oriented to person, place, and time. She appears well-developed and well-nourished. No distress.  HENT:  Head: Normocephalic and atraumatic.   Eyes: Conjunctivae and EOM are normal.  Neck: Normal range of motion.  Cardiovascular: Normal rate and regular rhythm.  Exam reveals no gallop and no friction rub.   No murmur heard. Pulmonary/Chest: Effort normal and breath sounds normal. She has no wheezes. She has no rales. She exhibits no tenderness.  Abdominal: Soft. She exhibits no distension. There is tenderness. There is no rebound and no guarding.  Suprapubic tenderness to palpation. No other focal tenderness.   Musculoskeletal: Normal range of motion.  Neurological: She is alert and oriented to person, place, and time. Coordination normal.  Speech is goal-oriented. Moves limbs without ataxia.   Skin: Skin is warm and dry.  Psychiatric: She has a normal mood and affect. Her behavior is normal.  Nursing note and vitals reviewed.   ED Course  Procedures (including critical care time) Labs Review Labs Reviewed  URINALYSIS, ROUTINE W REFLEX MICROSCOPIC - Abnormal; Notable for the following:    APPearance CLOUDY (*)    Hgb urine dipstick TRACE (*)    Leukocytes, UA MODERATE (*)    All other components within normal limits  URINE MICROSCOPIC-ADD ON - Abnormal; Notable for the following:    Bacteria, UA MANY (*)    All other components within normal limits  PREGNANCY, URINE    Imaging Review No results found.   EKG Interpretation None      MDM   Final diagnoses:  UTI (lower urinary tract infection)    9:09 PM Patient has a UTI. Vitals stable and patient afebrile. Patient will be treated with keflex.    Emilia BeckKaitlyn Samentha Perham, PA-C 09/12/14 2113  Arby BarretteMarcy Pfeiffer, MD 09/13/14 [phone]

## 2014-09-12 NOTE — Discharge Instructions (Signed)
Take keflex as directed until gone. Refer to attached documents for more information.

## 2015-03-05 ENCOUNTER — Encounter (HOSPITAL_BASED_OUTPATIENT_CLINIC_OR_DEPARTMENT_OTHER): Payer: Self-pay | Admitting: Emergency Medicine

## 2015-03-05 ENCOUNTER — Emergency Department (HOSPITAL_BASED_OUTPATIENT_CLINIC_OR_DEPARTMENT_OTHER)
Admission: EM | Admit: 2015-03-05 | Discharge: 2015-03-05 | Disposition: A | Discharge status: Home / Self Care | Payer: Medicaid Other | Attending: Emergency Medicine | Admitting: Emergency Medicine

## 2015-03-05 DIAGNOSIS — Z3202 Encounter for pregnancy test, result negative: Secondary | ICD-10-CM | POA: Insufficient documentation

## 2015-03-05 DIAGNOSIS — N390 Urinary tract infection, site not specified: Secondary | ICD-10-CM

## 2015-03-05 DIAGNOSIS — Z720 Tobacco use: Secondary | ICD-10-CM | POA: Insufficient documentation

## 2015-03-05 DIAGNOSIS — Z792 Long term (current) use of antibiotics: Secondary | ICD-10-CM | POA: Insufficient documentation

## 2015-03-05 DIAGNOSIS — Z8659 Personal history of other mental and behavioral disorders: Secondary | ICD-10-CM | POA: Insufficient documentation

## 2015-03-05 DIAGNOSIS — Z79899 Other long term (current) drug therapy: Secondary | ICD-10-CM | POA: Insufficient documentation

## 2015-03-05 DIAGNOSIS — Z87442 Personal history of urinary calculi: Secondary | ICD-10-CM | POA: Insufficient documentation

## 2015-03-05 HISTORY — DX: Urinary tract infection, site not specified: N39.0

## 2015-03-05 LAB — PREGNANCY, URINE: Preg Test, Ur: NEGATIVE

## 2015-03-05 LAB — URINE MICROSCOPIC-ADD ON

## 2015-03-05 LAB — URINALYSIS, ROUTINE W REFLEX MICROSCOPIC
Bilirubin Urine: NEGATIVE
GLUCOSE, UA: NEGATIVE mg/dL
KETONES UR: NEGATIVE mg/dL
NITRITE: POSITIVE — AB
PROTEIN: NEGATIVE mg/dL
SPECIFIC GRAVITY, URINE: 1.019 (ref 1.005–1.030)
Urobilinogen, UA: 0.2 mg/dL (ref 0.0–1.0)
pH: 5.5 (ref 5.0–8.0)

## 2015-03-05 MED ORDER — SULFAMETHOXAZOLE-TRIMETHOPRIM 800-160 MG PO TABS: 1.0000 | ORAL_TABLET | Freq: Two times a day (BID) | ORAL | Status: AC

## 2015-03-05 MED ORDER — PHENAZOPYRIDINE HCL 200 MG PO TABS: 200.0000 mg | ORAL_TABLET | Freq: Three times a day (TID) | ORAL | Status: DC

## 2015-03-05 NOTE — Discharge Instructions (Signed)

## 2015-03-05 NOTE — ED Notes (Signed)
Pt states she has had UTI symptoms for about a week, states she took an OTC medication but it got worse.

## 2015-03-05 NOTE — ED Provider Notes (Signed)
CSN: [id number]     Arrival date & time 03/05/15  1726 History   First MD Initiated Contact with Patient 03/05/15 1742     Chief Complaint  Patient presents with  . Urinary Frequency     (Consider location/radiation/quality/duration/timing/severity/associated sxs/prior Treatment) HPI Comments: States similar to previous uti. No vaginal discharge of flank pain. Was treated wit keflex in January for similar symptoms  Patient is a 29 y.o. female presenting with frequency. The history is provided by the patient.  Urinary Frequency This is a new problem. The current episode started in the past 7 days. The problem occurs constantly. The problem has been unchanged. Pertinent negatives include no abdominal pain, fever, myalgias or vomiting. Nothing aggravates the symptoms. She has tried nothing for the symptoms.    Past Medical History  Diagnosis Date  . Kidney stone   . Panic attacks   . UTI (lower urinary tract infection)    Past Surgical History  Procedure Laterality Date  . Kidney stone surgery     History reviewed. No pertinent family history. History  Substance Use Topics  . Smoking status: Current Every Day Smoker    Types: Cigarettes  . Smokeless tobacco: Never Used  . Alcohol Use: No   OB History    Gravida Para Term Preterm AB TAB SAB Ectopic Multiple Living   1              Review of Systems  Constitutional: Negative for fever.  Gastrointestinal: Negative for vomiting and abdominal pain.  Genitourinary: Positive for frequency.  Musculoskeletal: Negative for myalgias.  All other systems reviewed and are negative.     Allergies  Review of patient's allergies indicates no known allergies.  Home Medications   Prior to Admission medications   Medication Sig Start Date End Date Taking? Authorizing Provider  acetaminophen (TYLENOL) 500 MG tablet Take 1,000 mg by mouth every 6 (six) hours as needed for pain.    Historical Provider, MD  acetaminophen-codeine  (TYLENOL #3) 300-30 MG per tablet Take 1 tablet by mouth every 4 (four) hours as needed for pain.    Historical Provider, MD  cephALEXin (KEFLEX) 500 MG capsule Take 1 capsule (500 mg total) by mouth 4 (four) times daily. 09/12/14   Kaitlyn Szekalski, PA-C  Cyanocobalamin (VITAMIN B 12 PO) Take by mouth.    Historical Provider, MD  HYDROcodone-acetaminophen (NORCO/VICODIN) 5-325 MG per tablet Take 2 tablets by mouth every 4 (four) hours as needed for pain. 03/21/13   Teressa LowerVrinda Aleila Syverson, NP  metroNIDAZOLE (FLAGYL) 500 MG tablet Take 1 tablet (500 mg total) by mouth 2 (two) times daily. 03/21/13   Teressa LowerVrinda Ekta Dancer, NP  mirtazapine (REMERON) 15 MG tablet Take 15 mg by mouth at bedtime.    Historical Provider, MD  oxyCODONE-acetaminophen (PERCOCET/ROXICET) 5-325 MG per tablet Take 2 tablets by mouth every 4 (four) hours as needed for pain. 09/19/12   Glynn OctaveStephen Rancour, MD  Prenatal Vit-Fe Fumarate-FA (PRENATAL MULTIVITAMIN) TABS tablet Take 1 tablet by mouth daily at 12 noon.    Historical Provider, MD  sulfamethoxazole-trimethoprim (BACTRIM DS,SEPTRA DS) 800-160 MG per tablet Take 1 tablet by mouth 2 (two) times daily. 03/05/15 03/12/15  Teressa LowerVrinda Wyonia Fontanella, NP   BP 112/74 mmHg  Pulse 75  Temp(Src) 98.2 F (36.8 C) (Oral)  Resp 18  Ht 5\' 6"  (1.676 m)  Wt 122 lb (55.339 kg)  BMI 19.70 kg/m2  SpO2 100%  LMP 02/03/2015 Physical Exam  Constitutional: She is oriented to person, place, and time. She  appears well-developed and well-nourished.  Cardiovascular: Normal rate and regular rhythm.   Pulmonary/Chest: Effort normal and breath sounds normal.  Abdominal: Soft. Bowel sounds are normal. There is no tenderness.  Musculoskeletal: Normal range of motion.  Neurological: She is alert and oriented to person, place, and time.  Skin: Skin is warm and dry.  Psychiatric: She has a normal mood and affect.  Nursing note and vitals reviewed.   ED Course  Procedures (including critical care time) Labs Review Labs  Reviewed  URINALYSIS, ROUTINE W REFLEX MICROSCOPIC (NOT AT [facility]) - Abnormal; Notable for the following:    APPearance CLOUDY (*)    Hgb urine dipstick SMALL (*)    Nitrite POSITIVE (*)    Leukocytes, UA LARGE (*)    All other components within normal limits  URINE MICROSCOPIC-ADD ON - Abnormal; Notable for the following:    Squamous Epithelial / LPF FEW (*)    Bacteria, UA MANY (*)    All other components within normal limits  URINE CULTURE  PREGNANCY, URINE    Imaging Review No results found.   EKG Interpretation None      MDM   Final diagnoses:  UTI (lower urinary tract infection)    Pt treated for simple uti with pyridium and bactrim. Discussed return precautions with pt    Teressa Lower, NP 03/05/15 1806  Nelva Nay, MD 03/10/15 2158

## 2015-03-09 LAB — URINE CULTURE: Colony Count: >=100000

## 2015-03-10 ENCOUNTER — Telehealth (HOSPITAL_BASED_OUTPATIENT_CLINIC_OR_DEPARTMENT_OTHER): Payer: Self-pay | Admitting: Emergency Medicine

## 2015-03-10 NOTE — Telephone Encounter (Signed)
Post ED Visit - Positive Culture Follow-up  Culture report reviewed by antimicrobial stewardship pharmacist: []  Wes Dulaney, Pharm.D., BCPS []  Celedonio MiyamotoJeremy Frens, Pharm.D., BCPS []  Georgina PillionElizabeth Martin, Pharm.D., BCPS []  [place]Minh Pham, [address]Ph[address]D., BCPS, AAHIVP [x]  Estella HuskMichelle Turner, Pharm.D., BCPS, AAHIVP []  Elder CyphersLorie Poole, [address]Ph[address]D., BCPS  Positive urine  Culture E. Coli Treated with bactrim DS, organism sensitive to the same and no further patient follow-up is required at this time.  Berle MullMiller, Ellana Kawa 03/10/2015, 11:24 AM

## 2015-08-18 ENCOUNTER — Emergency Department (HOSPITAL_BASED_OUTPATIENT_CLINIC_OR_DEPARTMENT_OTHER): Payer: Medicaid Other

## 2015-08-18 ENCOUNTER — Encounter (HOSPITAL_BASED_OUTPATIENT_CLINIC_OR_DEPARTMENT_OTHER): Payer: Self-pay

## 2015-08-18 ENCOUNTER — Emergency Department (HOSPITAL_BASED_OUTPATIENT_CLINIC_OR_DEPARTMENT_OTHER)
Admission: EM | Admit: 2015-08-18 | Discharge: 2015-08-18 | Disposition: A | Discharge status: Home / Self Care | Payer: Medicaid Other | Attending: Emergency Medicine | Admitting: Emergency Medicine

## 2015-08-18 DIAGNOSIS — F17210 Nicotine dependence, cigarettes, uncomplicated: Secondary | ICD-10-CM | POA: Insufficient documentation

## 2015-08-18 DIAGNOSIS — Z87442 Personal history of urinary calculi: Secondary | ICD-10-CM | POA: Diagnosis not present

## 2015-08-18 DIAGNOSIS — Z79899 Other long term (current) drug therapy: Secondary | ICD-10-CM | POA: Insufficient documentation

## 2015-08-18 DIAGNOSIS — Z87440 Personal history of urinary (tract) infections: Secondary | ICD-10-CM | POA: Insufficient documentation

## 2015-08-18 DIAGNOSIS — N730 Acute parametritis and pelvic cellulitis: Secondary | ICD-10-CM | POA: Insufficient documentation

## 2015-08-18 DIAGNOSIS — F419 Anxiety disorder, unspecified: Secondary | ICD-10-CM | POA: Insufficient documentation

## 2015-08-18 DIAGNOSIS — Z3202 Encounter for pregnancy test, result negative: Secondary | ICD-10-CM | POA: Insufficient documentation

## 2015-08-18 DIAGNOSIS — R102 Pelvic and perineal pain: Secondary | ICD-10-CM

## 2015-08-18 DIAGNOSIS — Z792 Long term (current) use of antibiotics: Secondary | ICD-10-CM | POA: Insufficient documentation

## 2015-08-18 DIAGNOSIS — R109 Unspecified abdominal pain: Secondary | ICD-10-CM | POA: Diagnosis present

## 2015-08-18 LAB — URINE MICROSCOPIC-ADD ON

## 2015-08-18 LAB — URINALYSIS, ROUTINE W REFLEX MICROSCOPIC
BILIRUBIN URINE: NEGATIVE
GLUCOSE, UA: NEGATIVE mg/dL
Ketones, ur: NEGATIVE mg/dL
Leukocytes, UA: NEGATIVE
Nitrite: NEGATIVE
PH: 6.5 (ref 5.0–8.0)
Protein, ur: NEGATIVE mg/dL
SPECIFIC GRAVITY, URINE: 1.021 (ref 1.005–1.030)
UROBILINOGEN UA: 0.2 mg/dL (ref 0.0–1.0)

## 2015-08-18 LAB — BASIC METABOLIC PANEL
ANION GAP: 3 — AB (ref 5–15)
BUN: 12 mg/dL (ref 6–20)
CALCIUM: 8.7 mg/dL — AB (ref 8.9–10.3)
CO2: 23 mmol/L (ref 22–32)
Chloride: 109 mmol/L (ref 101–111)
Creatinine, Ser: 0.62 mg/dL (ref 0.44–1.00)
GLUCOSE: 93 mg/dL (ref 65–99)
POTASSIUM: 4.1 mmol/L (ref 3.5–5.1)
SODIUM: 135 mmol/L (ref 135–145)

## 2015-08-18 LAB — WET PREP, GENITAL
TRICH WET PREP: NONE SEEN
Yeast Wet Prep HPF POC: NONE SEEN

## 2015-08-18 LAB — CBC WITH DIFFERENTIAL/PLATELET
BASOS ABS: 0 10*3/uL (ref 0.0–0.1)
BASOS PCT: 0 %
Eosinophils Absolute: 0.1 10*3/uL (ref 0.0–0.7)
Eosinophils Relative: 1 %
HEMATOCRIT: 34.6 % — AB (ref 36.0–46.0)
Hemoglobin: 11.1 g/dL — ABNORMAL LOW (ref 12.0–15.0)
LYMPHS PCT: 12 %
Lymphs Abs: 1.5 10*3/uL (ref 0.7–4.0)
MCH: 29.7 pg (ref 26.0–34.0)
MCHC: 32.1 g/dL (ref 30.0–36.0)
MCV: 92.5 fL (ref 78.0–100.0)
MONO ABS: 0.8 10*3/uL (ref 0.1–1.0)
Monocytes Relative: 7 %
NEUTROS ABS: 10.1 10*3/uL — AB (ref 1.7–7.7)
Neutrophils Relative %: 80 %
PLATELETS: 288 10*3/uL (ref 150–400)
RBC: 3.74 MIL/uL — AB (ref 3.87–5.11)
RDW: 13.6 % (ref 11.5–15.5)
WBC: 12.6 10*3/uL — AB (ref 4.0–10.5)

## 2015-08-18 LAB — PREGNANCY, URINE: Preg Test, Ur: NEGATIVE

## 2015-08-18 MED ORDER — NAPROXEN 500 MG PO TABS: 500.0000 mg | ORAL_TABLET | Freq: Two times a day (BID) | ORAL | Status: DC

## 2015-08-18 MED ORDER — SODIUM CHLORIDE 0.9 % IV SOLN: INTRAVENOUS | Status: DC

## 2015-08-18 MED ORDER — FENTANYL CITRATE (PF) 100 MCG/2ML IJ SOLN
50.0000 ug | Freq: Once | INTRAMUSCULAR | Status: AC
  Administered 2015-08-18: 50 ug via INTRAVENOUS
  Filled 2015-08-18: qty 2

## 2015-08-18 MED ORDER — SODIUM CHLORIDE 0.9 % IV BOLUS (SEPSIS)
250.0000 mL | Freq: Once | INTRAVENOUS | Status: AC
  Administered 2015-08-18: 250 mL via INTRAVENOUS

## 2015-08-18 MED ORDER — DEXTROSE 5 % IV SOLN
1.0000 g | Freq: Once | INTRAVENOUS | Status: AC
  Administered 2015-08-18: 1 g via INTRAVENOUS

## 2015-08-18 MED ORDER — METRONIDAZOLE 500 MG PO TABS: 500.0000 mg | ORAL_TABLET | Freq: Two times a day (BID) | ORAL | Status: DC

## 2015-08-18 MED ORDER — CEFTRIAXONE SODIUM 1 G IJ SOLR
INTRAMUSCULAR | Status: AC
  Filled 2015-08-18: qty 10

## 2015-08-18 MED ORDER — ONDANSETRON HCL 4 MG/2ML IJ SOLN
4.0000 mg | Freq: Once | INTRAMUSCULAR | Status: AC
Start: 2015-08-18 — End: 2015-08-18
  Administered 2015-08-18: 4 mg via INTRAVENOUS
  Filled 2015-08-18: qty 2

## 2015-08-18 MED ORDER — DOXYCYCLINE HYCLATE 100 MG PO CAPS: 100.0000 mg | ORAL_CAPSULE | Freq: Two times a day (BID) | ORAL | Status: DC

## 2015-08-18 NOTE — Discharge Instructions (Signed)
Pelvic Inflammatory Disease °Pelvic inflammatory disease (PID) refers to an infection in some or all of the female organs. The infection can be in the uterus, ovaries, fallopian tubes, or the surrounding tissues in the pelvis. PID can cause abdominal or pelvic pain that comes on suddenly (acute pelvic pain). PID is a serious infection because it can lead to lasting (chronic) pelvic pain or the inability to have children (infertility). °CAUSES °This condition is most often caused by an infection that is spread during sexual contact. However, the infection can also be caused by the normal bacteria that are found in the vaginal tissues if these bacteria travel upward into the reproductive organs. PID can also occur following: °· The birth of a baby. °· A miscarriage. °· An abortion. °· Major pelvic surgery. °· The use of an intrauterine device (IUD). °· A sexual assault. °RISK FACTORS °This condition is more likely to develop in women who: °· Are younger than 31 years of age. °· Are sexually active at a young age. °· Use nonbarrier contraception. °· Have multiple sexual partners. °· Have sex with someone who has symptoms of an STD (sexually transmitted disease). °· Use oral contraception. °At times, certain behaviors can also increase the possibility of getting PID, such as: °· Using a vaginal douche. °· Having an IUD in place. °SYMPTOMS °Symptoms of this condition include: °· Abdominal or pelvic pain. °· Fever. °· Chills. °· Abnormal vaginal discharge. °· Abnormal uterine bleeding. °· Unusual pain shortly after the end of a menstrual period. °· Painful urination. °· Pain with sexual intercourse. °· Nausea and vomiting. °DIAGNOSIS °To diagnose this condition, your health care provider will do a physical exam and take your medical history. A pelvic exam typically reveals great tenderness in the uterus and the surrounding pelvic tissues. You may also have tests, such as: °· Lab tests, including a pregnancy test, blood  tests, and urine test. °· Culture tests of the vagina and cervix to check for an STD. °· Ultrasound. °· A laparoscopic procedure to look inside the pelvis. °· Examining vaginal secretions under a microscope. °TREATMENT °Treatment for this condition may involve one or more approaches. °· Antibiotic medicines may be prescribed to be taken by mouth. °· Sexual partners may need to be treated if the infection is caused by an STD. °· For more severe cases, hospitalization may be needed to give antibiotics directly into a vein through an IV tube. °· Surgery may be needed if other treatments do not help, but this is rare. °It may take weeks until you are completely well. If you are diagnosed with PID, you should also be checked for human immunodeficiency virus (HIV). Your health care provider may test you for infection again 3 months after treatment. You should not have unprotected sex. °HOME CARE INSTRUCTIONS °· Take over-the-counter and prescription medicines only as told by your health care provider. °· If you were prescribed an antibiotic medicine, take it as told by your health care provider. Do not stop taking the antibiotic even if you start to feel better. °· Do not have sexual intercourse until treatment is completed or as told by your health care provider. If PID is confirmed, your recent sexual partners will need treatment, especially if you had unprotected sex. °· Keep all follow-up visits as told by your health care provider. This is important. °SEEK MEDICAL CARE IF: °· You have increased or abnormal vaginal discharge. °· Your pain does not improve. °· You vomit. °· You have a fever. °· You   cannot tolerate your medicines.  Your partner has an STD.  You have pain when you urinate. SEEK IMMEDIATE MEDICAL CARE IF:  You have increased abdominal or pelvic pain.  You have chills.  Your symptoms are not better in 72 hours even with treatment.   This information is not intended to replace advice given to  you by your health care provider. Make sure you discuss any questions you have with your health care provider.   Take antibiotics as directed. Take the Naprosyn as needed for pain. Make appointment to follow-up with your OB/GYN to have thickening of the uterus lining reevaluated. Return for any new or worse symptoms.     Document Released: 09/20/2005 Document Revised: 06/11/2015 Document Reviewed: 10/28/2014 Elsevier Interactive Patient Education [employer]2016 [employer] Inc.

## 2015-08-18 NOTE — ED Notes (Signed)
Reports severe abd cramps.  Reports only see blood when she wipes. Reports nausea. Unsure if preg.

## 2015-08-18 NOTE — ED Provider Notes (Signed)
CSN: [id number]     Arrival date & time 08/18/15  0825 History   First MD Initiated Contact with Patient 08/18/15 [id number]     Chief Complaint  Patient presents with  . Abdominal Pain     (Consider location/radiation/quality/duration/timing/severity/associated sxs/prior Treatment) Patient is a 29 y.o. female presenting with abdominal pain. The history is provided by the patient.  Abdominal Pain Associated symptoms: nausea and vaginal bleeding   Associated symptoms: no chest pain, no dysuria, no fever, no vaginal discharge and no vomiting   patient's last menstrual period was the end of October. Patient started with lower abdominal crampy pain yesterday with vaginal bleeding light no clots. Associated with nausea but no vomiting. Associated with lightheadedness. Pain is 8 out of 10.  Past Medical History  Diagnosis Date  . Kidney stone   . Panic attacks   . UTI (lower urinary tract infection)    Past Surgical History  Procedure Laterality Date  . Kidney stone surgery     No family history on file. Social History  Substance Use Topics  . Smoking status: Current Every Day Smoker    Types: Cigarettes  . Smokeless tobacco: Never Used  . Alcohol Use: No   OB History    Gravida Para Term Preterm AB TAB SAB Ectopic Multiple Living   1              Review of Systems  Constitutional: Negative for fever.  HENT: Negative for congestion.   Eyes: Negative for redness.  Cardiovascular: Negative for chest pain.  Gastrointestinal: Positive for nausea and abdominal pain. Negative for vomiting.  Genitourinary: Positive for vaginal bleeding and pelvic pain. Negative for dysuria and vaginal discharge.  Musculoskeletal: Negative for back pain.  Skin: Negative for rash.  Neurological: Positive for light-headedness. Negative for dizziness and headaches.  Hematological: Does not bruise/bleed easily.  Psychiatric/Behavioral: Negative for confusion.      Allergies  Review of patient's  allergies indicates no known allergies.  Home Medications   Prior to Admission medications   Medication Sig Start Date End Date Taking? Authorizing Provider  acetaminophen (TYLENOL) 500 MG tablet Take 1,000 mg by mouth every 6 (six) hours as needed for pain.    Historical Provider, MD  acetaminophen-codeine (TYLENOL #3) 300-30 MG per tablet Take 1 tablet by mouth every 4 (four) hours as needed for pain.    Historical Provider, MD  cephALEXin (KEFLEX) 500 MG capsule Take 1 capsule (500 mg total) by mouth 4 (four) times daily. 09/12/14   Kaitlyn Szekalski, PA-C  Cyanocobalamin (VITAMIN B 12 PO) Take by mouth.    Historical Provider, MD  HYDROcodone-acetaminophen (NORCO/VICODIN) 5-325 MG per tablet Take 2 tablets by mouth every 4 (four) hours as needed for pain. 03/21/13   Teressa Lower, NP  metroNIDAZOLE (FLAGYL) 500 MG tablet Take 1 tablet (500 mg total) by mouth 2 (two) times daily. 03/21/13   Teressa Lower, NP  mirtazapine (REMERON) 15 MG tablet Take 15 mg by mouth at bedtime.    Historical Provider, MD  oxyCODONE-acetaminophen (PERCOCET/ROXICET) 5-325 MG per tablet Take 2 tablets by mouth every 4 (four) hours as needed for pain. 09/19/12   Glynn Octave, MD  phenazopyridine (PYRIDIUM) 200 MG tablet Take 1 tablet (200 mg total) by mouth 3 (three) times daily. 03/05/15   Teressa Lower, NP  Prenatal Vit-Fe Fumarate-FA (PRENATAL MULTIVITAMIN) TABS tablet Take 1 tablet by mouth daily at 12 noon.    Historical Provider, MD   BP 94/58 mmHg  Pulse 52  Temp(Src) 98.5 F (36.9 C) (Oral)  Resp 16  Ht  (1.676 m)  Wt 117 lb (53.071 kg)  BMI 18.89 kg/m2  SpO2 100%  LMP 08/04/2015 Physical Exam  Constitutional: She is oriented to person, place, and time. She appears well-developed and well-nourished. No distress.  HENT:  Head: Normocephalic and atraumatic.  Mouth/Throat: Oropharynx is clear and moist.  Eyes: EOM are normal. Pupils are equal, round, and reactive to light.  Neck: Normal  range of motion.  Cardiovascular: Normal rate and normal heart sounds.   No murmur heard. Pulmonary/Chest: Effort normal and breath sounds normal. No respiratory distress.  Abdominal: Soft. Bowel sounds are normal. There is tenderness.  Tenderness palpation of the suprapubic area.  Genitourinary: Vaginal discharge found.  External genitalia is normal. Vaginal vault without any blood. But had a yellow brown discharge. Cervical motion tenderness tenderness to uterus and bilateral adnexa.  Musculoskeletal: Normal range of motion.  Neurological: She is alert and oriented to person, place, and time. No cranial nerve deficit. She exhibits normal muscle tone. Coordination normal.  Skin: Skin is warm. No rash noted.    ED Course  Procedures (including critical care time) Labs Review Labs Reviewed  URINALYSIS, ROUTINE W REFLEX MICROSCOPIC (NOT AT [facility]) - Abnormal; Notable for the following:    APPearance CLOUDY (*)    Hgb urine dipstick SMALL (*)    All other components within normal limits  URINE MICROSCOPIC-ADD ON - Abnormal; Notable for the following:    Squamous Epithelial / LPF FEW (*)    Bacteria, UA MANY (*)    All other components within normal limits  BASIC METABOLIC PANEL - Abnormal; Notable for the following:    Calcium 8.7 (*)    Anion gap 3 (*)    All other components within normal limits  CBC WITH DIFFERENTIAL/PLATELET - Abnormal; Notable for the following:    WBC 12.6 (*)    RBC 3.74 (*)    Hemoglobin 11.1 (*)    HCT 34.6 (*)    Neutro Abs 10.1 (*)    All other components within normal limits  WET PREP, GENITAL  PREGNANCY, URINE  HIV ANTIBODY (ROUTINE TESTING)  GC/CHLAMYDIA PROBE AMP ([place]) NOT AT [facility]    Imaging Review US Transvaginal Non-ob  08/18/2015  CLINICAL DATA:  29 year old female with pelvic pain and spotting for 2 days. LMP 08/04/2015, day 15 of menstrual cycle. EXAM: TRANSABDOMINAL AND TRANSVAGINAL ULTRASOUND OF PELVIS TECHNIQUE: Both  transabdominal and transvaginal ultrasound examinations of the pelvis were performed. Transabdominal technique was performed for global imaging of the pelvis including uterus, ovaries, adnexal regions, and pelvic cul-de-sac. It was necessary to proceed with endovaginal exam following the transabdominal exam to visualize the endometrium and ovaries. COMPARISON:  03/21/2013 CT abdomen/pelvis. FINDINGS: Uterus Measurements: 9.0 x 4.7 x 4.6 cm. Retroverted retroflexed uterus is normal in size and configuration, with no uterine fibroids or other myometrial abnormality. Endometrium Thickness: 17 mm. Endometrium is heterogeneous, with no focal endometrial lesion or endometrial cavity fluid demonstrated. Right ovary Measurements: 1.9 x 1.6 x 1.4 cm. There is a 1.9 x 1.4 x 1.6 cm right ovarian cyst with eccentric heterogeneous internal echoes in keeping with retractile clot, with no internal vascularity, in keeping with a right ovarian hemorrhagic cyst. No additional right adnexal findings. Left ovary Measurements: 2.9 x 1.7 x 2.1 cm. Normal appearance/no adnexal mass. Other findings No abnormal free fluid in the pelvis. IMPRESSION: 1. Heterogeneous and abnormally thickened (17 mm) endometrium, with no focal endometrial  lesion detected. Consider follow-up pelvic ultrasound in 6-8 weeks in the early proliferative phase of the menstrual cycle after initiation of hormonal or medical therapy. If the abnormal uterine bleeding is already refractory to hormonal or medical therapy, consider endometrial biopsy, as clinically warranted. 2. No uterine fibroids. 3. Small right ovarian hemorrhagic cyst. No suspicious ovarian or adnexal findings. Electronically Signed   By: Delbert PhenixJason A Poff M.D.   On: 08/18/2015 11:35   [place] Pelvis Complete  08/18/2015  CLINICAL DATA:  29 year old female with pelvic pain and spotting for 2 days. LMP 08/04/2015, day 15 of menstrual cycle. EXAM: TRANSABDOMINAL AND TRANSVAGINAL ULTRASOUND OF PELVIS TECHNIQUE:  Both transabdominal and transvaginal ultrasound examinations of the pelvis were performed. Transabdominal technique was performed for global imaging of the pelvis including uterus, ovaries, adnexal regions, and pelvic cul-de-sac. It was necessary to proceed with endovaginal exam following the transabdominal exam to visualize the endometrium and ovaries. COMPARISON:  03/21/2013 CT abdomen/pelvis. FINDINGS: Uterus Measurements: 9.0 x 4.7 x 4.6 cm. Retroverted retroflexed uterus is normal in size and configuration, with no uterine fibroids or other myometrial abnormality. Endometrium Thickness: 17 mm. Endometrium is heterogeneous, with no focal endometrial lesion or endometrial cavity fluid demonstrated. Right ovary Measurements: 1.9 x 1.6 x 1.4 cm. There is a 1.9 x 1.4 x 1.6 cm right ovarian cyst with eccentric heterogeneous internal echoes in keeping with retractile clot, with no internal vascularity, in keeping with a right ovarian hemorrhagic cyst. No additional right adnexal findings. Left ovary Measurements: 2.9 x 1.7 x 2.1 cm. Normal appearance/no adnexal mass. Other findings No abnormal free fluid in the pelvis. IMPRESSION: 1. Heterogeneous and abnormally thickened (17 mm) endometrium, with no focal endometrial lesion detected. Consider follow-up pelvic ultrasound in 6-8 weeks in the early proliferative phase of the menstrual cycle after initiation of hormonal or medical therapy. If the abnormal uterine bleeding is already refractory to hormonal or medical therapy, consider endometrial biopsy, as clinically warranted. 2. No uterine fibroids. 3. Small right ovarian hemorrhagic cyst. No suspicious ovarian or adnexal findings. Electronically Signed   By: Delbert PhenixJason A Poff M.D.   On: 08/18/2015 11:35   I have personally reviewed and evaluated these images and lab results as part of my medical decision-making.   EKG Interpretation None      MDM   Final diagnoses:  PID (acute pelvic inflammatory disease)     Patient's presenting complaint was abdominal cramping and vaginal bleeding. Also associated with some nausea. Patient was unsure if she was pregnant or not. Raising test was negative. Patient had tenderness over the uterus on abdominal exam. Ultrasound showed no evidence of any ovarian cyst or hemorrhagic cyst that shows some thickening of the uterus. On pelvic examination patient had no blood in the vaginal vault at all there was a discharge from the cervix. Sort of yellow brown in color. Tenderness to cervical motion and also tenderness to palpation of the uterus and some adnexal tenderness bilaterally. Clinically consistent with pelvic inflammatory disease. Cultures were done. Wet prep is pending. Start on Rocephin and continue with doxycycline patient also has OB/GYN for follow-up.    Vanetta MuldersScott Nur Rabold, MD 08/18/15 [id number]

## 2015-08-18 NOTE — ED Notes (Signed)
Report given to Andrea, RN.

## 2015-08-18 NOTE — ED Notes (Signed)
Patient in [place].

## 2015-08-19 LAB — HIV ANTIBODY (ROUTINE TESTING W REFLEX): HIV SCREEN 4TH GENERATION: NONREACTIVE

## 2015-08-19 LAB — GC/CHLAMYDIA PROBE AMP (~~LOC~~) NOT AT ARMC
Chlamydia: NEGATIVE
Neisseria Gonorrhea: NEGATIVE

## 2016-06-18 ENCOUNTER — Encounter (HOSPITAL_BASED_OUTPATIENT_CLINIC_OR_DEPARTMENT_OTHER): Payer: Self-pay | Admitting: *Deleted

## 2016-06-18 ENCOUNTER — Emergency Department (HOSPITAL_BASED_OUTPATIENT_CLINIC_OR_DEPARTMENT_OTHER)
Admission: EM | Admit: 2016-06-18 | Discharge: 2016-06-18 | Disposition: A | Discharge status: Home / Self Care | Payer: Medicaid Other | Attending: Emergency Medicine | Admitting: Emergency Medicine

## 2016-06-18 DIAGNOSIS — R300 Dysuria: Secondary | ICD-10-CM

## 2016-06-18 DIAGNOSIS — B9689 Other specified bacterial agents as the cause of diseases classified elsewhere: Secondary | ICD-10-CM

## 2016-06-18 DIAGNOSIS — F17210 Nicotine dependence, cigarettes, uncomplicated: Secondary | ICD-10-CM | POA: Insufficient documentation

## 2016-06-18 DIAGNOSIS — N760 Acute vaginitis: Secondary | ICD-10-CM | POA: Insufficient documentation

## 2016-06-18 LAB — URINALYSIS, ROUTINE W REFLEX MICROSCOPIC
Bilirubin Urine: NEGATIVE
Glucose, UA: NEGATIVE mg/dL
Hgb urine dipstick: NEGATIVE
KETONES UR: NEGATIVE mg/dL
NITRITE: NEGATIVE
PROTEIN: NEGATIVE mg/dL
Specific Gravity, Urine: 1.017 (ref 1.005–1.030)
pH: 8 (ref 5.0–8.0)

## 2016-06-18 LAB — URINE MICROSCOPIC-ADD ON: RBC / HPF: NONE SEEN RBC/hpf (ref 0–5)

## 2016-06-18 LAB — PREGNANCY, URINE: PREG TEST UR: NEGATIVE

## 2016-06-18 LAB — WET PREP, GENITAL
SPERM: NONE SEEN
Trich, Wet Prep: NONE SEEN
Yeast Wet Prep HPF POC: NONE SEEN

## 2016-06-18 MED ORDER — METRONIDAZOLE 0.75 % VA GEL: 1.0000 | Freq: Every day | VAGINAL | 0 refills | Status: AC

## 2016-06-18 MED ORDER — METRONIDAZOLE 500 MG PO TABS: 500.0000 mg | ORAL_TABLET | Freq: Two times a day (BID) | ORAL | 0 refills | Status: AC

## 2016-06-18 NOTE — Discharge Instructions (Signed)
Take the Flagyl as prescribed and be sure to complete the entire course. Do not drink alcohol while taking this medication as it can cause vomiting. Follow up with your OB/GYN or the [facility] in 3 days if symptoms are not improving. Your gonorrhea, chlamydia, HIV, syphilis testing is pending. If anything results positive you receive a phone call. Always practice safe sex by using condoms.  Return immediately to the emergency department if you experience fever, abdominal pain, nausea, vomiting, vaginal discharge, or any other concerning symptoms.

## 2016-06-18 NOTE — ED Triage Notes (Signed)
Pt c/o painful freq urination x 3 days 

## 2016-06-18 NOTE — ED Provider Notes (Signed)
[facility] Provider Note   CSN: [id number] Arrival date & time: 06/18/16  1926  By signing my name below, I, Soijett Blue, attest that this documentation has been prepared under the direction and in the presence of Ellwyn Ergle L. Rhona Raider, PA-C Electronically Signed: Soijett Blue, ED Scribe. 06/18/16. 8:00 PM.   History   Chief Complaint Chief Complaint  Patient presents with  . Dysuria    HPI Destiny Morrow is a 30 y.o. female with a PMHx of kidney stone, UTI, who presents to the Emergency Department complaining of intermittent dysuria onset 2 days. Pt notes that she took a shower and noticed a burning sensation while urinating. Pt reports that she has had an UTI and kidney stones in the past. Pt is having associated symptoms of urinary frequency, and lower abdominal pain. She notes that she has not tried any medications for the relief of her symptoms. She denies vaginal discharge, vaginal odor, hematuria, nausea, vomiting, and any other symptoms.    The history is provided by the patient. No language interpreter was used.    Past Medical History:  Diagnosis Date  . Kidney stone   . Panic attacks   . UTI (lower urinary tract infection)     There are no active problems to display for this patient.   Past Surgical History:  Procedure Laterality Date  . KIDNEY STONE SURGERY      OB History    Gravida Para Term Preterm AB Living   1             SAB TAB Ectopic Multiple Live Births                   Home Medications    Prior to Admission medications   Medication Sig Start Date End Date Taking? Authorizing Provider  acetaminophen (TYLENOL) 500 MG tablet Take 1,000 mg by mouth every 6 (six) hours as needed for pain.    Historical Provider, MD  acetaminophen-codeine (TYLENOL #3) 300-30 MG per tablet Take 1 tablet by mouth every 4 (four) hours as needed for pain.    Historical Provider, MD  Cyanocobalamin (VITAMIN B 12 PO) Take by mouth.    Historical Provider, MD   HYDROcodone-acetaminophen (NORCO/VICODIN) 5-325 MG per tablet Take 2 tablets by mouth every 4 (four) hours as needed for pain. 03/21/13   Teressa Lower, NP  metroNIDAZOLE (FLAGYL) 500 MG tablet Take 1 tablet (500 mg total) by mouth 2 (two) times daily. 06/18/16 06/25/16  Jerre Simon, PA  metroNIDAZOLE (METROGEL) 0.75 % vaginal gel Place 1 Applicatorful vaginally at bedtime. For 5 days 06/18/16 06/23/16  Jerre Simon, PA  mirtazapine (REMERON) 15 MG tablet Take 15 mg by mouth at bedtime.    Historical Provider, MD  oxyCODONE-acetaminophen (PERCOCET/ROXICET) 5-325 MG per tablet Take 2 tablets by mouth every 4 (four) hours as needed for pain. 09/19/12   Glynn Octave, MD  phenazopyridine (PYRIDIUM) 200 MG tablet Take 1 tablet (200 mg total) by mouth 3 (three) times daily. 03/05/15   Teressa Lower, NP  Prenatal Vit-Fe Fumarate-FA (PRENATAL MULTIVITAMIN) TABS tablet Take 1 tablet by mouth daily at 12 noon.    Historical Provider, MD    Family History History reviewed. No pertinent family history.  Social History Social History  Substance Use Topics  . Smoking status: Current Every Day Smoker    Packs/day: 0.50    Types: Cigarettes  . Smokeless tobacco: Never Used  . Alcohol use No     Allergies  Review of patient's allergies indicates no known allergies.   Review of Systems Review of Systems  Gastrointestinal: Positive for abdominal pain (lower). Negative for nausea and vomiting.  Genitourinary: Positive for dysuria and frequency. Negative for hematuria and vaginal discharge.       -vaginal odor     Physical Exam Updated Vital Signs BP 101/64 (BP Location: Left Arm)   Pulse 72   Temp 97.7 F (36.5 C) (Oral)   Resp 18   Ht 5\' 7"  (1.702 m)   Wt 132 lb (59.9 kg)   LMP 06/04/2016   SpO2 100%   BMI 20.67 kg/m   Physical Exam  Constitutional: She appears well-developed and well-nourished. No distress.  HENT:  Head: Normocephalic and atraumatic.  Eyes: Conjunctivae  are normal.  Cardiovascular: Normal rate, regular rhythm and normal heart sounds.  Exam reveals no gallop and no friction rub.   No murmur heard. Pulmonary/Chest: Effort normal and breath sounds normal. No respiratory distress. She has no wheezes. She has no rales.  Abdominal: Soft. Bowel sounds are normal. There is tenderness in the suprapubic area. There is no CVA tenderness.  Suprapubic tenderness. No CVA tenderness  Genitourinary:  Genitourinary Comments: Chaperone present for exam.  Exam performed by Jerre Simon,  exam chaperoned Date: 06/18/2016 Pelvic exam: normal external genitalia without evidence of trauma. VULVA: normal appearing vulva with no masses, tenderness or lesion. VAGINA: normal appearing vagina with normal color and discharge, no lesions. CERVIX: normal appearing cervix without lesions, cervical motion tenderness absent, cervical os closed with out purulent discharge; vaginal discharge - creamy, malodorous and milky, Wet prep and DNA probe for chlamydia and GC obtained.   ADNEXA: normal adnexa in size, nontender and no masses UTERUS: uterus is normal size, shape, consistency and nontender.    Musculoskeletal: Normal range of motion.  Neurological: She is alert. Coordination normal.  Skin: Skin is warm and dry. She is not diaphoretic.  Psychiatric: She has a normal mood and affect. Her behavior is normal.  Nursing note and vitals reviewed.    ED Treatments / Results  DIAGNOSTIC STUDIES: Oxygen Saturation is 100% on RA, nl by my interpretation.    COORDINATION OF CARE: 7:58 PM Discussed treatment plan with pt at bedside which includes UA and pt agreed to plan.   Labs (all labs ordered are listed, but only abnormal results are displayed) Labs Reviewed  WET PREP, GENITAL - Abnormal; Notable for the following:       Result Value   Clue Cells Wet Prep HPF POC PRESENT (*)    WBC, Wet Prep HPF POC MANY (*)    All other components within normal limits    URINALYSIS, ROUTINE W REFLEX MICROSCOPIC (NOT AT [facility]) - Abnormal; Notable for the following:    APPearance TURBID (*)    Leukocytes, UA MODERATE (*)    All other components within normal limits  URINE MICROSCOPIC-ADD ON - Abnormal; Notable for the following:    Squamous Epithelial / LPF 6-30 (*)    Bacteria, UA FEW (*)    All other components within normal limits  URINE CULTURE  PREGNANCY, URINE  RPR  HIV ANTIBODY (ROUTINE TESTING)  GC/CHLAMYDIA PROBE AMP ([place]) NOT AT [facility]    EKG  EKG Interpretation None       Radiology No results found.  Procedures Procedures (including critical care time)  Medications Ordered in ED Medications - No data to display   Initial Impression / Assessment and Plan / ED Course  I have reviewed the triage vital signs and the nursing notes.  Pertinent labs that were available during my care of the patient were reviewed by me and considered in my medical decision making (see chart for details).  Clinical Course    Pt with dysuria and clue cells on wet prep consistent with bacterial vaginosis. UA not concerning for UTI. Will send for culture. No cervical motion or adnexal tenderness on exam. No pain with pelvic bimanual exam. Less concerning for PID. Will not treat for Gonorrhea or chlamydia at this time. Gonorrhea, chlamydia, HIV, syphilis testing still pending. Pt afebrile, VSS, no leukocytosis. Instructed pt to follow up with her OB/GYN in 2-3 days if symptoms are not improving. Pt d/c with flagyl gel per her request. Discussed safe sex practices. Discussed strict return precautions. Pt expressed understanding to the discharge instructions.   Pt case discussed with Dr. Adela LankFloyd who agrees with the above plan.   Final Clinical Impressions(s) / ED Diagnoses   Final diagnoses:  BV (bacterial vaginosis)  Dysuria    New Prescriptions New Prescriptions   METRONIDAZOLE (FLAGYL) 500 MG TABLET    Take 1 tablet (500 mg total) by mouth 2  (two) times daily.   METRONIDAZOLE (METROGEL) 0.75 % VAGINAL GEL    Place 1 Applicatorful vaginally at bedtime. For 5 days    I personally performed the services described in this documentation, which was scribed in my presence. The recorded information has been reviewed and is accurate.       Jerre SimonJessica L Annagrace Carr, PA 06/18/16 2224    Melene Planan Floyd, DO 06/18/16 2333

## 2016-06-20 LAB — HIV ANTIBODY (ROUTINE TESTING W REFLEX): HIV Screen 4th Generation wRfx: NONREACTIVE

## 2016-06-20 LAB — RPR: RPR: NONREACTIVE

## 2016-06-21 LAB — URINE CULTURE: Culture: >=100000 — AB

## 2016-06-21 LAB — GC/CHLAMYDIA PROBE AMP (~~LOC~~) NOT AT ARMC
CHLAMYDIA, DNA PROBE: NEGATIVE
Neisseria Gonorrhea: NEGATIVE

## 2016-06-22 ENCOUNTER — Telehealth (HOSPITAL_BASED_OUTPATIENT_CLINIC_OR_DEPARTMENT_OTHER): Payer: Self-pay | Admitting: Emergency Medicine

## 2016-06-22 NOTE — Progress Notes (Signed)
ED Antimicrobial Stewardship Positive Culture Follow Up   Destiny Morrow is an 30 y.o. female who [facility] with a chief complaint of  Chief Complaint  Patient presents with  . Dysuria    Recent Results (from the past 720 hour(s))  Urine culture     Status: Abnormal   Collection Time: 06/18/16  7:35 PM  Result Value Ref Range Status   Specimen Description URINE, RANDOM  Final   Special Requests NONE  Final   Culture >=100,000 COLONIES/mL ESCHERICHIA COLI (A)  Final   Report Status 06/21/2016 FINAL  Final   Organism ID, Bacteria ESCHERICHIA COLI (A)  Final      Susceptibility   Escherichia coli - MIC*    AMPICILLIN >=32 RESISTANT Resistant     CEFAZOLIN <=4 SENSITIVE Sensitive     CEFTRIAXONE <=1 SENSITIVE Sensitive     CIPROFLOXACIN <=0.25 SENSITIVE Sensitive     GENTAMICIN >=16 RESISTANT Resistant     IMIPENEM <=0.25 SENSITIVE Sensitive     NITROFURANTOIN <=16 SENSITIVE Sensitive     TRIMETH/SULFA >=320 RESISTANT Resistant     AMPICILLIN/SULBACTAM 16 INTERMEDIATE Intermediate     PIP/TAZO <=4 SENSITIVE Sensitive     Extended ESBL NEGATIVE Sensitive     * >=100,000 COLONIES/mL ESCHERICHIA COLI  Wet prep, genital     Status: Abnormal   Collection Time: 06/18/16  8:40 PM  Result Value Ref Range Status   Yeast Wet Prep HPF POC NONE SEEN NONE SEEN Final   Trich, Wet Prep NONE SEEN NONE SEEN Final   Clue Cells Wet Prep HPF POC PRESENT (A) NONE SEEN Final   WBC, Wet Prep HPF POC MANY (A) NONE SEEN Final   Sperm NONE SEEN  Final     [x]  Patient discharged originally without antimicrobial agent and treatment is now indicated  Will call patient and ask how she is doing. If not better, then will treat with Bactrim DS twice a day for 3 days.     ED Provider: Gaylyn RongSamantha Dowless, PA-C   Hillis RangeEmily A Stewart, Pharm D 06/22/2016, 8:37 AM Pharmacy Resident  Phone# [phone]

## 2016-06-22 NOTE — Telephone Encounter (Signed)
Post ED Visit - Positive Culture Follow-up: Successful Patient Follow-Up  Culture assessed and recommendations reviewed by: []  Enzo BiNathan Batchelder, Pharm.D. []  Celedonio MiyamotoJeremy Frens, Pharm.D., BCPS []  Garvin FilaMike Maccia, Pharm.D. []  Georgina PillionElizabeth Martin, Pharm.D., BCPS []  Lake MillsMinh Pham, [address]Pharm[address]D., BCPS, AAHIVP []  Estella HuskMichelle Turner, Pharm.D., BCPS, AAHIVP []  Tennis Mustassie Stewart, [address]Pharm.D. []  Sherle Poeob Vincent, [place]Pharm.Rolly Salter. Emily Stewart PharmD  Positive urine culture  []  Patient discharged without antimicrobial prescription and treatment is now indicated []  Organism is resistant to prescribed ED discharge antimicrobial []  Patient with positive blood cultures  Changes discussed with ED provider: Gaylyn RongSamantha Dowless PA New antibiotic prescription symptom check, if no better Bactrim DS BID x 3 days if flank pain  Attempting to reach patient   Berle MullMiller, Nahia Nissan 06/22/2016, 11:38 AM

## 2016-07-27 ENCOUNTER — Telehealth (HOSPITAL_BASED_OUTPATIENT_CLINIC_OR_DEPARTMENT_OTHER): Payer: Self-pay | Admitting: Emergency Medicine

## 2016-07-27 NOTE — Telephone Encounter (Signed)
Lost to followup 

## 2017-05-08 IMAGING — US US PELVIS COMPLETE
1 series · 13 of 25 positions shown · non-contrast
Comparison: 03/21/2013 CT abdomen/pelvis.

CLINICAL DATA: 29-year-old female with pelvic pain and spotting for
2 days. LMP 08/04/2015, day 15 of menstrual cycle.

EXAM:
TRANSABDOMINAL AND TRANSVAGINAL ULTRASOUND OF PELVIS
TECHNIQUE: Both transabdominal and transvaginal ultrasound examinations of the
pelvis were performed. Transabdominal technique was performed for
global imaging of the pelvis including uterus, ovaries, adnexal
regions, and pelvic cul-de-sac. It was necessary to proceed with
endovaginal exam following the transabdominal exam to visualize the
endometrium and ovaries.

[Series 1: us pelvis complete · 0.24mm/px · 13 of 76 slices shown]
[im 1/76]
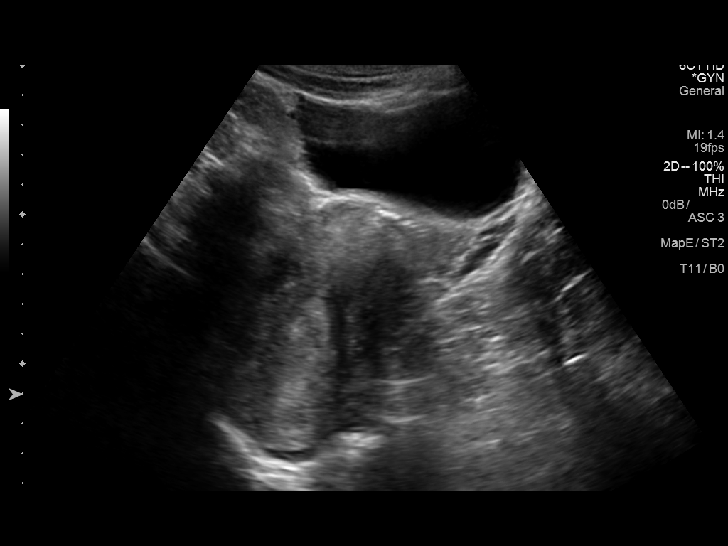
[im 7/76]
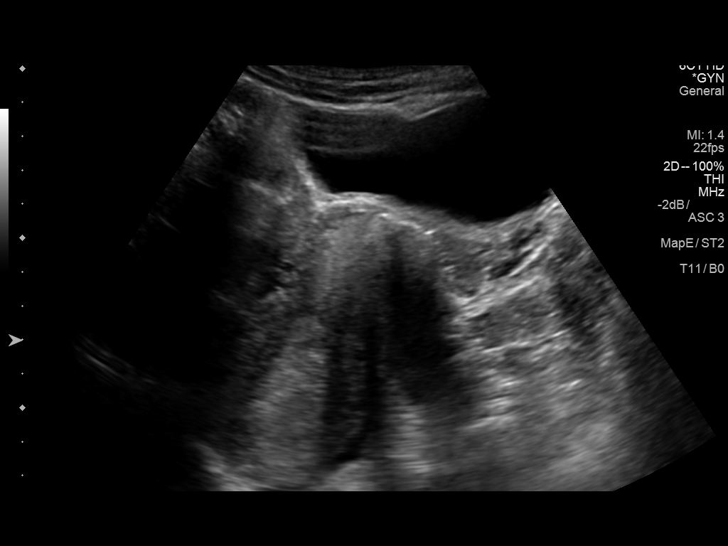
[im 13/76]
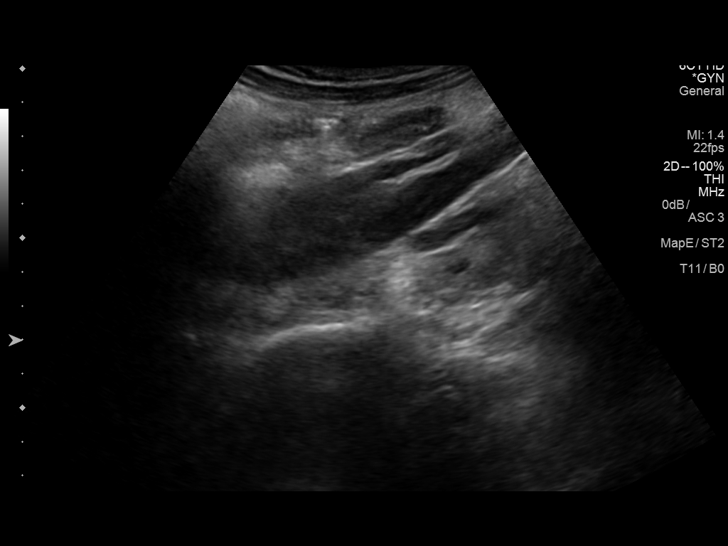
[im 19/76]
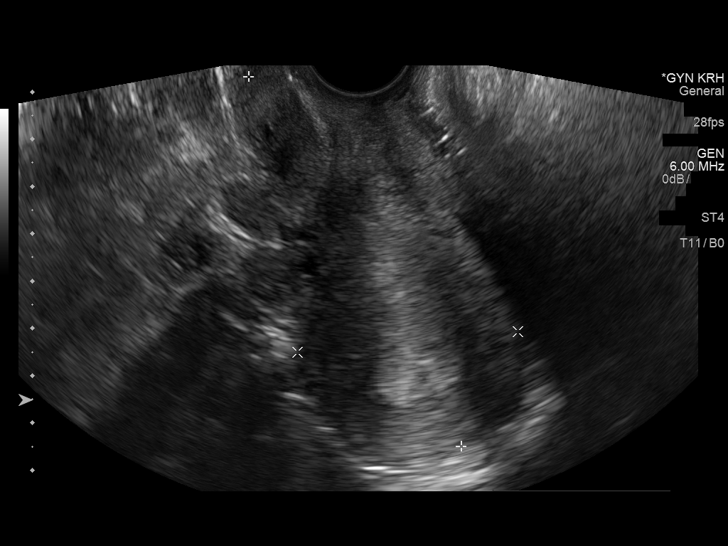
[im 26/76]
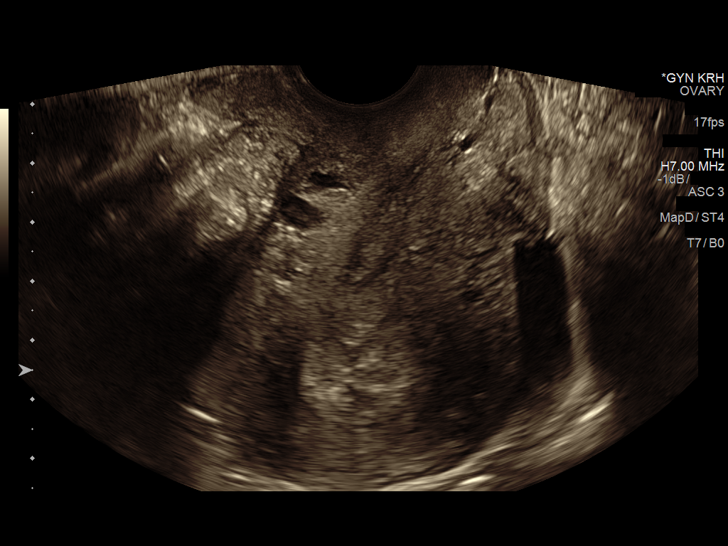
[im 32/76]
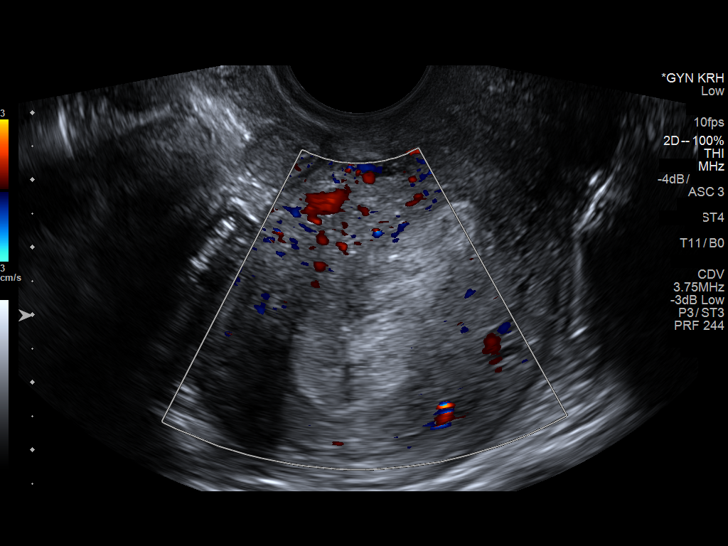
[im 38/76]
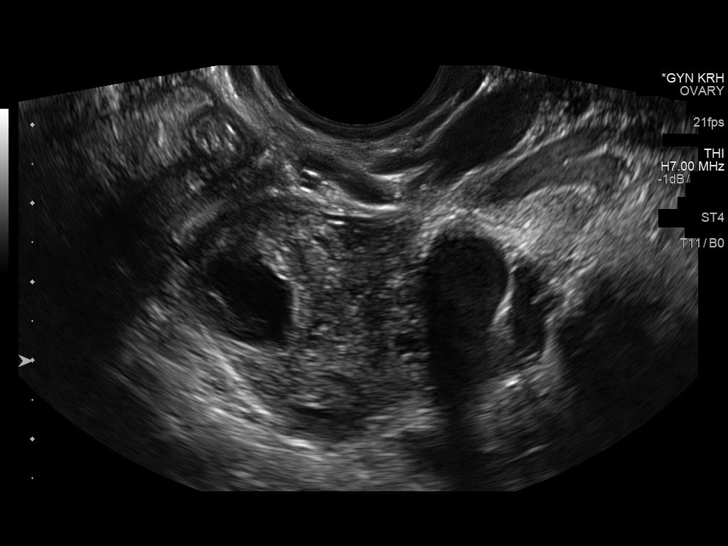
[im 44/76]
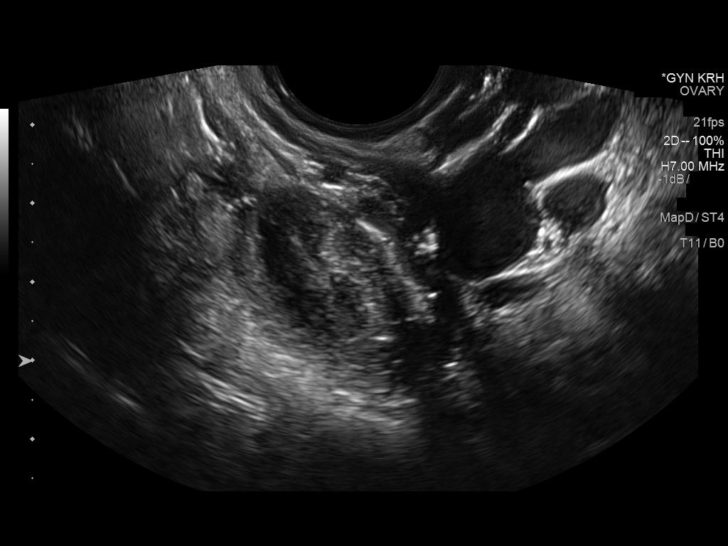
[im 51/76]
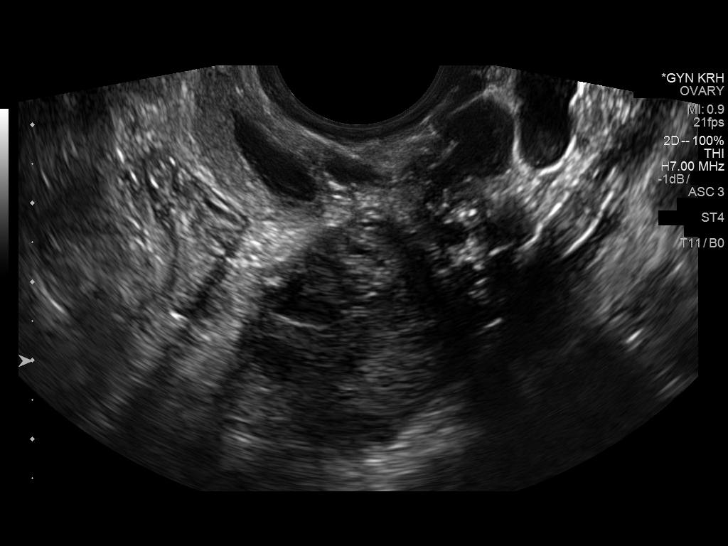
[im 57/76]
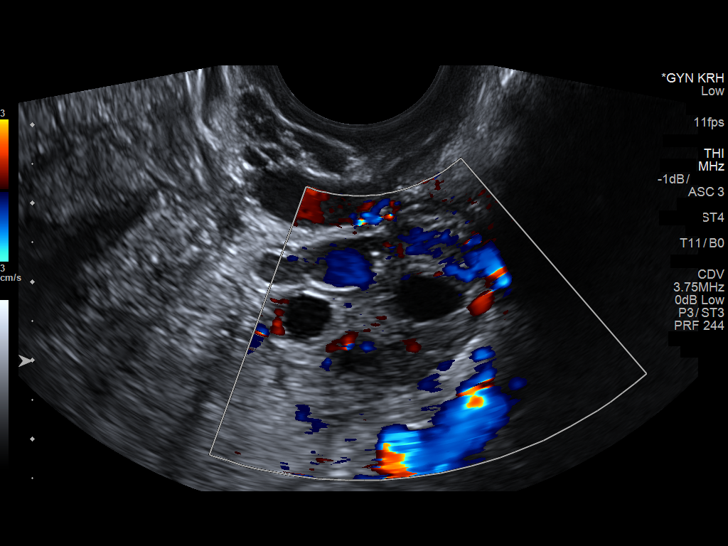
[im 63/76]
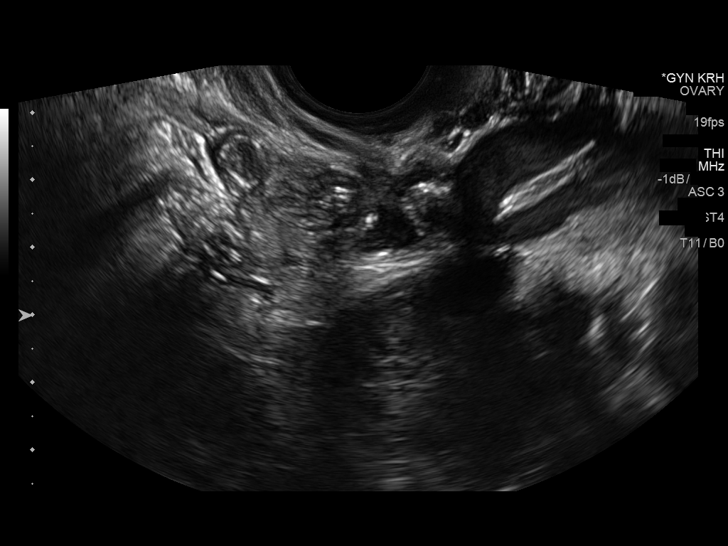
[im 69/76]
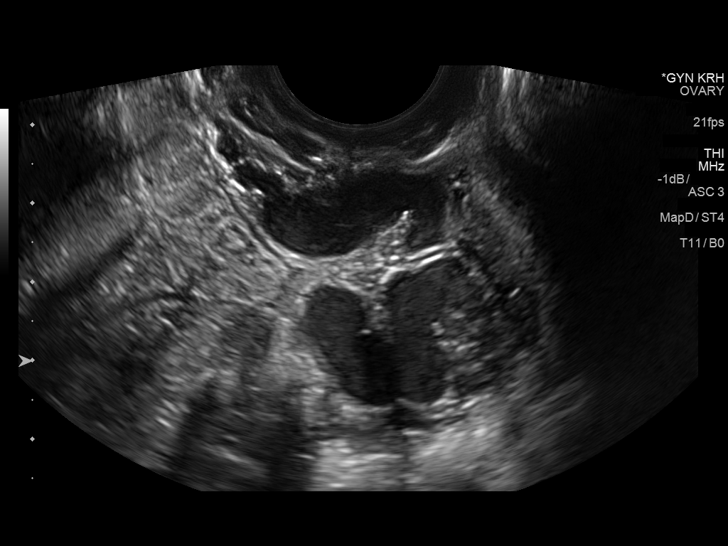
[im 76/76]
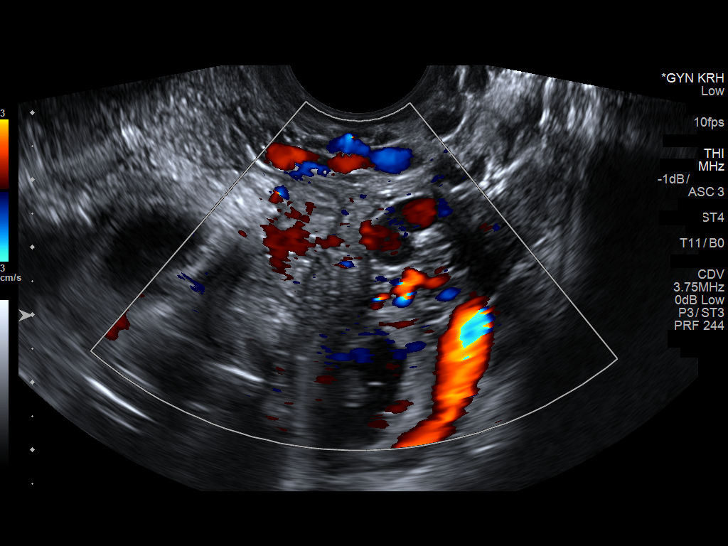

[13 of 25 positions shown; findings below may reference images not displayed]

FINDINGS: Uterus

Measurements: 9.0 x 4.7 x 4.6 cm. Retroverted retroflexed uterus is
normal in size and configuration, with no uterine fibroids or other
myometrial abnormality.

Endometrium

Thickness: 17 mm. Endometrium is heterogeneous, with no focal
endometrial lesion or endometrial cavity fluid demonstrated.

Right ovary

Measurements: 1.9 x 1.6 x 1.4 cm. There is a 1.9 x 1.4 x 1.6 cm
right ovarian cyst with eccentric heterogeneous internal echoes in
keeping with retractile clot, with no internal vascularity, in
keeping with a right ovarian hemorrhagic cyst. No additional right
adnexal findings.

Left ovary

Measurements: 2.9 x 1.7 x 2.1 cm. Normal appearance/no adnexal mass.

Other findings

No abnormal free fluid in the pelvis.
IMPRESSION: 1. Heterogeneous and abnormally thickened (17 mm) endometrium, with
no focal endometrial lesion detected. Consider follow-up pelvic
ultrasound in 6-8 weeks in the early proliferative phase of the
menstrual cycle after initiation of hormonal or medical therapy. If
the abnormal uterine bleeding is already refractory to hormonal or
medical therapy, consider endometrial biopsy, as clinically
warranted.
2. No uterine fibroids.
3. Small right ovarian hemorrhagic cyst. No suspicious ovarian or
adnexal findings.

## 2019-09-23 ENCOUNTER — Emergency Department (HOSPITAL_BASED_OUTPATIENT_CLINIC_OR_DEPARTMENT_OTHER): Payer: Medicaid Other

## 2019-09-23 ENCOUNTER — Other Ambulatory Visit: Payer: Self-pay

## 2019-09-23 ENCOUNTER — Emergency Department (HOSPITAL_BASED_OUTPATIENT_CLINIC_OR_DEPARTMENT_OTHER)
Admission: EM | Admit: 2019-09-23 | Discharge: 2019-09-23 | Disposition: A | Discharge status: Home / Self Care | Payer: Medicaid Other | Attending: Emergency Medicine | Admitting: Emergency Medicine

## 2019-09-23 ENCOUNTER — Encounter (HOSPITAL_BASED_OUTPATIENT_CLINIC_OR_DEPARTMENT_OTHER): Payer: Self-pay | Admitting: Emergency Medicine

## 2019-09-23 DIAGNOSIS — M79671 Pain in right foot: Secondary | ICD-10-CM | POA: Diagnosis present

## 2019-09-23 DIAGNOSIS — Z87891 Personal history of nicotine dependence: Secondary | ICD-10-CM | POA: Insufficient documentation

## 2019-09-23 DIAGNOSIS — M722 Plantar fascial fibromatosis: Secondary | ICD-10-CM | POA: Diagnosis not present

## 2019-09-23 MED ORDER — MELOXICAM 7.5 MG PO TABS: 7.5000 mg | ORAL_TABLET | Freq: Every day | ORAL | 0 refills | Status: DC

## 2019-09-23 NOTE — ED Notes (Signed)
Patient returned from X-ray 

## 2019-09-23 NOTE — ED Provider Notes (Signed)
[facility] EMERGENCY DEPARTMENT Provider Note   CSN: [id number] Arrival date & time: 09/23/19  1321     History Chief Complaint  Patient presents with  . Foot Pain    Destiny Morrow is a 33 y.o. female with history of kidney stones, panic attack presents with 1 month history of right foot pain.  Patient reports pain at the bottom of her foot as well as on the top.  Her pain is worse in the morning.  She denies any injury or trauma.  She has had some tingling on the top of her foot as well.  She has been taking Tylenol at home without relief.  She reports she was working on her feet a lot and so she recently got laid off.  She wears either sneakers or slide sandals.  HPI     Past Medical History:  Diagnosis Date  . Kidney stone   . Panic attacks   . UTI (lower urinary tract infection)     There are no problems to display for this patient.   Past Surgical History:  Procedure Laterality Date  . KIDNEY STONE SURGERY       OB History    Gravida  1   Para      Term      Preterm      AB      Living        SAB      TAB      Ectopic      Multiple      Live Births              No family history on file.  Social History   Tobacco Use  . Smoking status: Former Smoker    Packs/day: 0.50    Types: Cigarettes  . Smokeless tobacco: Never Used  Substance Use Topics  . Alcohol use: No  . Drug use: Yes    Home Medications Prior to Admission medications   Medication Sig Start Date End Date Taking? Authorizing Provider  Multiple Vitamins-Minerals (ONE-A-DAY WOMENS PO) Take by mouth.   Yes [provider]  acetaminophen (TYLENOL) 500 MG tablet Take 1,000 mg by mouth every 6 (six) hours as needed for pain.    [provider]  Cyanocobalamin (VITAMIN B 12 PO) Take by mouth.    [provider]  meloxicam (MOBIC) 7.5 MG tablet Take 1 tablet (7.5 mg total) by mouth daily. 09/23/19   Jann Ra, Bea Graff, PA-C  mirtazapine  (REMERON) 15 MG tablet Take 15 mg by mouth at bedtime.    [provider]    Allergies    Patient has no known allergies.  Review of Systems   Review of Systems  Constitutional: Negative for fever.  Musculoskeletal: Positive for arthralgias.  Neurological: Positive for numbness (paresthesia).    Physical Exam Updated Vital Signs BP (!) 135/91 (BP Location: Right Arm)   Pulse 72   Temp 99 F (37.2 C) (Oral)   Resp 16   Ht 5\' 6"  (1.676 m)   Wt 59.4 kg   LMP 09/19/2019   SpO2 100%   BMI 21.14 kg/m   Physical Exam Vitals and nursing note reviewed.  Constitutional:      General: She is not in acute distress.    Appearance: She is well-developed. She is not diaphoretic.  HENT:     Head: Normocephalic and atraumatic.     Mouth/Throat:     Pharynx: No oropharyngeal exudate.  Eyes:  General: No scleral icterus.       Right eye: No discharge.        Left eye: No discharge.     Conjunctiva/sclera: Conjunctivae normal.     Pupils: Pupils are equal, round, and reactive to light.  Neck:     Thyroid: No thyromegaly.  Cardiovascular:     Rate and Rhythm: Normal rate and regular rhythm.     Heart sounds: Normal heart sounds. No murmur. No friction rub. No gallop.   Pulmonary:     Effort: Pulmonary effort is normal. No respiratory distress.     Breath sounds: Normal breath sounds. No stridor. No wheezing or rales.  Abdominal:     General: Bowel sounds are normal. There is no distension.     Palpations: Abdomen is soft.     Tenderness: There is no abdominal tenderness. There is no guarding or rebound.  Musculoskeletal:     Cervical back: Normal range of motion and neck supple.       Feet:     Comments: Mild-moderate pes planus  Lymphadenopathy:     Cervical: No cervical adenopathy.  Skin:    General: Skin is warm and dry.     Coloration: Skin is not pale.     Findings: No rash.  Neurological:     Mental Status: She is alert.     Coordination: Coordination  normal.     ED Results / Procedures / Treatments   Labs (all labs ordered are listed, but only abnormal results are displayed) Labs Reviewed - No data to display  EKG None  Radiology DG Foot Complete Right  Result Date: 09/23/2019 CLINICAL DATA:  Right foot pain and tenderness in region of 1st metatarsal. No known injury. EXAM: RIGHT FOOT COMPLETE - 3+ VIEW COMPARISON:  None. FINDINGS: There is no evidence of fracture or dislocation. There is no evidence of arthropathy or other focal bone abnormality. Soft tissues are unremarkable. IMPRESSION: Negative. Electronically Signed   By: Destiny OrleansJohn A Morrow M.D.   On: 09/23/2019 14:58    Procedures Procedures (including critical care time)  Medications Ordered in ED Medications - No data to display  ED Course  I have reviewed the triage vital signs and the nursing notes.  Pertinent labs & imaging results that were available during my care of the patient were reviewed by me and considered in my medical decision making (see chart for details).    MDM Rules/Calculators/A&P                      Patient with suspected plantar fasciitis of the right foot.  X-ray completed considering other areas of tenderness, however this was negative.  Suspect these are hurting due to compensation with walking.  Will continue Tylenol and add Mobic.  Patient denies possibility of pregnancy.  Frozen juice can with massage discussed as well as orthotics.  Follow-up to podiatry as needed symptoms or not improving.  Good shoes with good arch support discussed.  Return precautions discussed.  Patient understands and agrees with plan.  Patient vitals stable throughout ED course and discharged in satisfactory condition.  Final Clinical Impression(s) / ED Diagnoses Final diagnoses:  Plantar fasciitis of right foot    Rx / DC Orders ED Discharge Orders         Ordered    meloxicam (MOBIC) 7.5 MG tablet  Daily     09/23/19 1510           Jayleana Colberg, WendellAlexandra M,  PA-C 09/23/19 1519    Geoffery Lyons, MD 09/26/19 1501

## 2019-09-23 NOTE — Discharge Instructions (Addendum)
Take Mobic as prescribed once daily for your pain.  You can alternate with Tylenol as prescribed over-the-counter, as needed for breakthrough pain.  Use ice or frozen water bottle 3-4 times daily and massage it on your foot.  I recommend buying specific orthotics for plantar fasciitis.  Attempt the stretches we discussed times daily as well.  Try to make sure you are wearing shoes with good arches at all times.  Avoid slide sandals.  Please follow-up with the podiatrist if your symptoms are not improving.  Please return the emergency department if you develop any new or worsening symptoms.

## 2019-09-23 NOTE — ED Triage Notes (Signed)
R foot pain x 1 month, worsening over the past week. Denies injury.

## 2020-04-16 ENCOUNTER — Other Ambulatory Visit: Payer: Self-pay

## 2020-04-16 ENCOUNTER — Encounter (HOSPITAL_BASED_OUTPATIENT_CLINIC_OR_DEPARTMENT_OTHER): Payer: Self-pay | Admitting: *Deleted

## 2020-04-16 ENCOUNTER — Emergency Department (HOSPITAL_BASED_OUTPATIENT_CLINIC_OR_DEPARTMENT_OTHER)
Admission: EM | Admit: 2020-04-16 | Discharge: 2020-04-16 | Disposition: A | Discharge status: Home / Self Care | Payer: Medicaid Other | Attending: Emergency Medicine | Admitting: Emergency Medicine

## 2020-04-16 DIAGNOSIS — N390 Urinary tract infection, site not specified: Secondary | ICD-10-CM | POA: Diagnosis not present

## 2020-04-16 DIAGNOSIS — R300 Dysuria: Secondary | ICD-10-CM | POA: Diagnosis present

## 2020-04-16 DIAGNOSIS — Z87442 Personal history of urinary calculi: Secondary | ICD-10-CM | POA: Insufficient documentation

## 2020-04-16 DIAGNOSIS — Z87891 Personal history of nicotine dependence: Secondary | ICD-10-CM | POA: Diagnosis not present

## 2020-04-16 LAB — URINALYSIS, ROUTINE W REFLEX MICROSCOPIC
Bilirubin Urine: NEGATIVE
Glucose, UA: NEGATIVE mg/dL
Ketones, ur: NEGATIVE mg/dL
Nitrite: NEGATIVE
Protein, ur: NEGATIVE mg/dL
Specific Gravity, Urine: >1.03 — ABNORMAL HIGH (ref 1.005–1.030)
pH: 5.5 (ref 5.0–8.0)

## 2020-04-16 LAB — PREGNANCY, URINE: Preg Test, Ur: NEGATIVE

## 2020-04-16 LAB — URINALYSIS, MICROSCOPIC (REFLEX)

## 2020-04-16 MED ORDER — CEPHALEXIN 500 MG PO CAPS: 500.0000 mg | ORAL_CAPSULE | Freq: Two times a day (BID) | ORAL | 0 refills | Status: AC

## 2020-04-16 MED FILL — CEPHALEXIN 500 MG CAPSULE: 500 | 5 days supply | Qty: 10 | Fill #0

## 2020-04-16 NOTE — Discharge Instructions (Signed)
Take antibiotics as prescribed.  Take the entire course, even if your symptoms improve. We sent her urine to grow out to make sure that this is the right antibiotic.  If not, you receive a phone call with new instructions. Make sure you are staying well-hydrated water. Follow-up with your OB/GYN at your scheduled appointment tomorrow and discuss your symptoms.  They may want to do testing at that time. Return to the emergency room with any new, worsening, concerning symptoms.

## 2020-04-16 NOTE — ED Provider Notes (Signed)
[facility] EMERGENCY DEPARTMENT Provider Note   CSN: [id number] Arrival date & time: 04/16/20  1414     History Chief Complaint  Patient presents with  . Dysuria    Gem Conkle is a 34 y.o. female presenting for evaluation of urinary frequency but minimal output.  Patient states the past 3 days, she has had increased urinary frequency, but is not having a lot of urinary output.  She reports nausea yesterday, but no vomiting.  She denies fevers, chills, abdominal pain.  She has a history of UTIs in the past, states this feels similar.  She denies dysuria or hematuria.  She denies vaginal discharge.  She is sexually active with one female partner who is symptom-free.  They do not use condoms.  She has no medical problems, takes medications daily.  She has appointment with her OB/GYN tomorrow.  HPI     Past Medical History:  Diagnosis Date  . Kidney stone   . Panic attacks   . UTI (lower urinary tract infection)     There are no problems to display for this patient.   Past Surgical History:  Procedure Laterality Date  . KIDNEY STONE SURGERY       OB History    Gravida  1   Para      Term      Preterm      AB      Living        SAB      TAB      Ectopic      Multiple      Live Births              No family history on file.  Social History   Tobacco Use  . Smoking status: Former Smoker    Packs/day: 0.50    Types: Cigarettes  . Smokeless tobacco: Never Used  Substance Use Topics  . Alcohol use: No  . Drug use: Yes    Home Medications Prior to Admission medications   Medication Sig Start Date End Date Taking? Authorizing Provider  acetaminophen (TYLENOL) 500 MG tablet Take 1,000 mg by mouth every 6 (six) hours as needed for pain.    [provider]  cephALEXin (KEFLEX) 500 MG capsule Take 1 capsule (500 mg total) by mouth 2 (two) times daily for 5 days. 04/16/20 04/21/20  Jamar Weatherall, PA-C  Cyanocobalamin (VITAMIN  B 12 PO) Take by mouth.    [provider]  meloxicam (MOBIC) 7.5 MG tablet Take 1 tablet (7.5 mg total) by mouth daily. 09/23/19   Law, Waylan Boga, PA-C  mirtazapine (REMERON) 15 MG tablet Take 15 mg by mouth at bedtime.    [provider]  Multiple Vitamins-Minerals (ONE-A-DAY WOMENS PO) Take by mouth.    [provider]    Allergies    Patient has no known allergies.  Review of Systems   Review of Systems  Genitourinary: Positive for decreased urine volume and frequency.  All other systems reviewed and are negative.   Physical Exam Updated Vital Signs BP 128/85 (BP Location: Right Arm)   Pulse 65   Temp 98.6 F (37 C)   Resp 19   Ht 5\' 6"  (1.676 m)   Wt 59.4 kg   LMP 04/01/2020   SpO2 100%   BMI 21.14 kg/m   Physical Exam Vitals and nursing note reviewed.  Constitutional:      General: She is not in acute distress.  Appearance: She is well-developed.     Comments: Sitting in the bed in no acute distress  HENT:     Head: Normocephalic and atraumatic.  Eyes:     Conjunctiva/sclera: Conjunctivae normal.     Pupils: Pupils are equal, round, and reactive to light.  Cardiovascular:     Rate and Rhythm: Normal rate and regular rhythm.     Pulses: Normal pulses.  Pulmonary:     Effort: Pulmonary effort is normal. No respiratory distress.     Breath sounds: Normal breath sounds. No wheezing.  Abdominal:     General: There is no distension.     Palpations: Abdomen is soft. There is no mass.     Tenderness: There is no abdominal tenderness. There is no guarding or rebound.     Comments: No ttp of the abdomen  Musculoskeletal:        General: Normal range of motion.     Cervical back: Normal range of motion and neck supple.  Skin:    General: Skin is warm and dry.     Capillary Refill: Capillary refill takes less than 2 seconds.  Neurological:     Mental Status: She is alert and oriented to person, place, and time.     ED Results /  Procedures / Treatments   Labs (all labs ordered are listed, but only abnormal results are displayed) Labs Reviewed  URINALYSIS, ROUTINE W REFLEX MICROSCOPIC - Abnormal; Notable for the following components:      Result Value   APPearance CLOUDY (*)    Specific Gravity, Urine >1.030 (*)    Hgb urine dipstick MODERATE (*)    Leukocytes,Ua TRACE (*)    All other components within normal limits  URINALYSIS, MICROSCOPIC (REFLEX) - Abnormal; Notable for the following components:   Bacteria, UA MANY (*)    All other components within normal limits  URINE CULTURE  PREGNANCY, URINE    EKG None  Radiology No results found.  Procedures Procedures (including critical care time)  Medications Ordered in ED Medications - No data to display  ED Course  I have reviewed the triage vital signs and the nursing notes.  Pertinent labs & imaging results that were available during my care of the patient were reviewed by me and considered in my medical decision making (see chart for details).    MDM Rules/Calculators/A&P                          Patient presenting for evaluation of urinary frequency and decreased urination.  On exam, patient appears nontoxic.  Exam is not consistent with systemic or septic infection.  She has a history of UTIs, states this feels similar.  Urine obtained in triage interpreted by me.  Does show leukocytes and many bacteria, however also 21-50 squamous cells.  Will ask patient for recollection.  However as she is having symptoms, will treat with antibiotics.  Will send for urine culture.  Offered pelvic exam, patient declined as she has an appoint with her OB/GYN tomorrow.  At this time, patient appears safe for discharge.  Return precautions given.  Patient states she understands and agrees to plan.  Final Clinical Impression(s) / ED Diagnoses Final diagnoses:  Urinary tract infection without hematuria, site unspecified    Rx / DC Orders ED Discharge Orders          Ordered    cephALEXin (KEFLEX) 500 MG capsule  2 times daily  Discontinue  Reprint     04/16/20 1713           Alveria Apley, PA-C 04/16/20 1717    Geoffery Lyons, MD 04/17/20 0000

## 2020-04-16 NOTE — ED Triage Notes (Signed)
C/o freq painful urination x 3 days 

## 2020-04-18 LAB — URINE CULTURE: Culture: <10000 — AB

## 2020-08-07 ENCOUNTER — Emergency Department (HOSPITAL_BASED_OUTPATIENT_CLINIC_OR_DEPARTMENT_OTHER): Payer: Medicaid Other

## 2020-08-07 ENCOUNTER — Other Ambulatory Visit (HOSPITAL_BASED_OUTPATIENT_CLINIC_OR_DEPARTMENT_OTHER): Payer: Self-pay | Admitting: Emergency Medicine

## 2020-08-07 ENCOUNTER — Encounter (HOSPITAL_BASED_OUTPATIENT_CLINIC_OR_DEPARTMENT_OTHER): Payer: Self-pay | Admitting: *Deleted

## 2020-08-07 ENCOUNTER — Emergency Department (HOSPITAL_BASED_OUTPATIENT_CLINIC_OR_DEPARTMENT_OTHER)
Admission: EM | Admit: 2020-08-07 | Discharge: 2020-08-07 | Disposition: A | Discharge status: Home / Self Care | Payer: Medicaid Other | Attending: Emergency Medicine | Admitting: Emergency Medicine

## 2020-08-07 ENCOUNTER — Other Ambulatory Visit: Payer: Self-pay

## 2020-08-07 DIAGNOSIS — R42 Dizziness and giddiness: Secondary | ICD-10-CM | POA: Diagnosis not present

## 2020-08-07 DIAGNOSIS — R110 Nausea: Secondary | ICD-10-CM | POA: Insufficient documentation

## 2020-08-07 DIAGNOSIS — Z20822 Contact with and (suspected) exposure to covid-19: Secondary | ICD-10-CM | POA: Insufficient documentation

## 2020-08-07 DIAGNOSIS — R531 Weakness: Secondary | ICD-10-CM | POA: Diagnosis not present

## 2020-08-07 DIAGNOSIS — Z87891 Personal history of nicotine dependence: Secondary | ICD-10-CM | POA: Insufficient documentation

## 2020-08-07 DIAGNOSIS — R55 Syncope and collapse: Secondary | ICD-10-CM | POA: Diagnosis present

## 2020-08-07 LAB — URINALYSIS, ROUTINE W REFLEX MICROSCOPIC
Bilirubin Urine: NEGATIVE
Glucose, UA: NEGATIVE mg/dL
Ketones, ur: NEGATIVE mg/dL
Leukocytes,Ua: NEGATIVE
Nitrite: NEGATIVE
Protein, ur: NEGATIVE mg/dL
Specific Gravity, Urine: >1.03 — ABNORMAL HIGH (ref 1.005–1.030)
pH: 5.5 (ref 5.0–8.0)

## 2020-08-07 LAB — CBC WITH DIFFERENTIAL/PLATELET
Abs Immature Granulocytes: 0.04 10*3/uL (ref 0.00–0.07)
Basophils Absolute: 0.1 10*3/uL (ref 0.0–0.1)
Basophils Relative: 1 %
Eosinophils Absolute: 0.5 10*3/uL (ref 0.0–0.5)
Eosinophils Relative: 6 %
HCT: 39.9 % (ref 36.0–46.0)
Hemoglobin: 12.8 g/dL (ref 12.0–15.0)
Immature Granulocytes: 0 %
Lymphocytes Relative: 14 %
Lymphs Abs: 1.3 10*3/uL (ref 0.7–4.0)
MCH: 29.6 pg (ref 26.0–34.0)
MCHC: 32.1 g/dL (ref 30.0–36.0)
MCV: 92.4 fL (ref 80.0–100.0)
Monocytes Absolute: 0.6 10*3/uL (ref 0.1–1.0)
Monocytes Relative: 7 %
Neutro Abs: 6.7 10*3/uL (ref 1.7–7.7)
Neutrophils Relative %: 72 %
Platelets: 350 10*3/uL (ref 150–400)
RBC: 4.32 MIL/uL (ref 3.87–5.11)
RDW: 12.6 % (ref 11.5–15.5)
WBC: 9.2 10*3/uL (ref 4.0–10.5)
nRBC: 0 % (ref 0.0–0.2)

## 2020-08-07 LAB — COMPREHENSIVE METABOLIC PANEL
ALT: 13 U/L (ref 0–44)
AST: 17 U/L (ref 15–41)
Albumin: 4.1 g/dL (ref 3.5–5.0)
Alkaline Phosphatase: 34 U/L — ABNORMAL LOW (ref 38–126)
Anion gap: 9 (ref 5–15)
BUN: 10 mg/dL (ref 6–20)
CO2: 23 mmol/L (ref 22–32)
Calcium: 9.4 mg/dL (ref 8.9–10.3)
Chloride: 106 mmol/L (ref 98–111)
Creatinine, Ser: 0.86 mg/dL (ref 0.44–1.00)
GFR, Estimated: >60 mL/min (ref >60)
Glucose, Bld: 101 mg/dL — ABNORMAL HIGH (ref 70–99)
Potassium: 4.3 mmol/L (ref 3.5–5.1)
Sodium: 138 mmol/L (ref 135–145)
Total Bilirubin: 1.4 mg/dL — ABNORMAL HIGH (ref 0.3–1.2)
Total Protein: 7.5 g/dL (ref 6.5–8.1)

## 2020-08-07 LAB — LIPASE, BLOOD: Lipase: 31 U/L (ref 11–51)

## 2020-08-07 LAB — URINALYSIS, MICROSCOPIC (REFLEX)

## 2020-08-07 LAB — RESPIRATORY PANEL BY RT PCR (FLU A&B, COVID)
Influenza A by PCR: NEGATIVE
Influenza B by PCR: NEGATIVE
SARS Coronavirus 2 by RT PCR: NEGATIVE

## 2020-08-07 LAB — PREGNANCY, URINE: Preg Test, Ur: NEGATIVE

## 2020-08-07 MED ORDER — MECLIZINE HCL 25 MG PO TABS
25.0000 mg | ORAL_TABLET | Freq: Three times a day (TID) | ORAL | 0 refills | Status: DC | PRN
Start: 2020-08-07 — End: 2020-08-07

## 2020-08-07 MED ORDER — SODIUM CHLORIDE 0.9 % IV BOLUS
1000.0000 mL | Freq: Once | INTRAVENOUS | Status: AC
  Administered 2020-08-07: 1000 mL via INTRAVENOUS

## 2020-08-07 MED ORDER — SODIUM CHLORIDE 0.9 % IV SOLN: INTRAVENOUS | Status: DC

## 2020-08-07 MED ORDER — ONDANSETRON HCL 4 MG/2ML IJ SOLN
4.0000 mg | Freq: Once | INTRAMUSCULAR | Status: AC
  Administered 2020-08-07: 4 mg via INTRAVENOUS
  Filled 2020-08-07: qty 2

## 2020-08-07 MED ORDER — MECLIZINE HCL 25 MG PO TABS
25.0000 mg | ORAL_TABLET | Freq: Once | ORAL | Status: AC
  Administered 2020-08-07: 25 mg via ORAL
  Filled 2020-08-07: qty 1

## 2020-08-07 MED FILL — MECLIZINE 25 MG TABLET: 25 | 10 days supply | Qty: 30 | Fill #0

## 2020-08-07 NOTE — ED Provider Notes (Signed)
[facility] EMERGENCY DEPARTMENT Provider Note   CSN: [id number] Arrival date & time: 08/07/20  7948     History Chief Complaint  Patient presents with  . Nausea    Destiny Morrow is a 34 y.o. female.  Patient with a complaint of dizziness lightheadedness weakness and nausea no vomiting no abdominal pain.  Since Tuesday.  No upper respiratory symptoms.  Patient states she had a syncopal episode yesterday morning.  Patient denies vomiting fever abdominal pain or chills.  No dysuria.  No diarrhea.  Patient without any Covid exposure.  Has been vaccinated.        Past Medical History:  Diagnosis Date  . Kidney stone   . Panic attacks   . UTI (lower urinary tract infection)     There are no problems to display for this patient.   Past Surgical History:  Procedure Laterality Date  . KIDNEY STONE SURGERY       OB History    Gravida  1   Para      Term      Preterm      AB      Living        SAB      TAB      Ectopic      Multiple      Live Births              No family history on file.  Social History   Tobacco Use  . Smoking status: Former Smoker    Packs/day: 0.50    Types: Cigarettes  . Smokeless tobacco: Never Used  Substance Use Topics  . Alcohol use: No  . Drug use: Yes    Home Medications Prior to Admission medications   Medication Sig Start Date End Date Taking? Authorizing Provider  acetaminophen (TYLENOL) 500 MG tablet Take 1,000 mg by mouth every 6 (six) hours as needed for pain.    [provider]  Cyanocobalamin (VITAMIN B 12 PO) Take by mouth.    [provider]  meloxicam (MOBIC) 7.5 MG tablet Take 1 tablet (7.5 mg total) by mouth daily. 09/23/19   Law, Waylan Boga, PA-C  mirtazapine (REMERON) 15 MG tablet Take 15 mg by mouth at bedtime.    [provider]  Multiple Vitamins-Minerals (ONE-A-DAY WOMENS PO) Take by mouth.    [provider]    Allergies    Patient has no  known allergies.  Review of Systems   Review of Systems  Constitutional: Positive for fatigue. Negative for chills and fever.  HENT: Negative for congestion, rhinorrhea and sore throat.   Eyes: Negative for visual disturbance.  Respiratory: Negative for cough and shortness of breath.   Cardiovascular: Negative for chest pain and leg swelling.  Gastrointestinal: Positive for nausea. Negative for abdominal pain, diarrhea and vomiting.  Genitourinary: Negative for dysuria.  Musculoskeletal: Negative for back pain and neck pain.  Skin: Negative for rash.  Neurological: Positive for dizziness, syncope, weakness and light-headedness. Negative for headaches.  Hematological: Does not bruise/bleed easily.  Psychiatric/Behavioral: Negative for confusion.    Physical Exam Updated Vital Signs BP 118/81   Pulse 64   Temp 98.8 F (37.1 C) (Oral)   Resp 13   LMP 07/22/2020 (Exact Date)   SpO2 100%   Physical Exam Vitals and nursing note reviewed.  Constitutional:      General: She is not in acute distress.    Appearance: Normal appearance. She is well-developed.  HENT:  Head: Normocephalic and atraumatic.  Eyes:     Extraocular Movements: Extraocular movements intact.     Conjunctiva/sclera: Conjunctivae normal.     Pupils: Pupils are equal, round, and reactive to light.  Cardiovascular:     Rate and Rhythm: Normal rate and regular rhythm.     Heart sounds: No murmur heard.   Pulmonary:     Effort: Pulmonary effort is normal. No respiratory distress.     Breath sounds: Normal breath sounds.  Abdominal:     Palpations: Abdomen is soft.     Tenderness: There is no abdominal tenderness.  Musculoskeletal:        General: Normal range of motion.     Cervical back: Normal range of motion and neck supple.  Skin:    General: Skin is warm and dry.  Neurological:     General: No focal deficit present.     Mental Status: She is alert and oriented to person, place, and time.      Cranial Nerves: No cranial nerve deficit.     Sensory: No sensory deficit.     Motor: No weakness.     ED Results / Procedures / Treatments   Labs (all labs ordered are listed, but only abnormal results are displayed) Labs Reviewed  RESPIRATORY PANEL BY RT PCR (FLU A&B, COVID)  CBC WITH DIFFERENTIAL/PLATELET  COMPREHENSIVE METABOLIC PANEL  URINALYSIS, ROUTINE W REFLEX MICROSCOPIC  LIPASE, BLOOD  PREGNANCY, URINE    EKG EKG Interpretation  Date/Time:  Thursday August 07 2020 08:51:56 EDT Ventricular Rate:  69 PR Interval:    QRS Duration: 85 QT Interval:  380 QTC Calculation: 408 R Axis:   -92 Text Interpretation: Sinus rhythm Left anterior fascicular block RSR' in V1 or V2, right VCD or RVH No previous ECGs available Confirmed by Vanetta Mulders [id number]) on 08/07/2020 8:57:03 AM   Radiology No results found.  Procedures Procedures (including critical care time)  Medications Ordered in ED Medications  0.9 %  sodium chloride infusion (has no administration in time range)  ondansetron (ZOFRAN) injection 4 mg (4 mg Intravenous Given 08/07/20 0901)  sodium chloride 0.9 % bolus 1,000 mL (1,000 mLs Intravenous New Bag/Given 08/07/20 0858)    ED Course  I have reviewed the triage vital signs and the nursing notes.  Pertinent labs & imaging results that were available during my care of the patient were reviewed by me and considered in my medical decision making (see chart for details).    MDM Rules/Calculators/A&P                          Patient denies any room spinning now.  But with closer questioning it sounds as if there was some room spinning the past couple days.  So the symptoms could be related to vertigo.  Based on this we will get CT head.  And will treat with Antivert.  Patient felt much better with the Antivert.  Dizziness almost completely resolved.  Head CT negative.  Rest of work-up without any acute lab abnormalities Covid testing negative.  No neuro  deficit.  Feel that this is vertigo uncomplicated.  We will treat patient with Antivert at home.  Work note provided.  She will return if symptoms do not resolve over the next week.  Or for anything new or worse.  Final Clinical Impression(s) / ED Diagnoses Final diagnoses:  Syncope, unspecified syncope type    Rx / DC Orders ED Discharge Orders  None       Vanetta Mulders, MD 08/07/20 1058

## 2020-08-07 NOTE — Discharge Instructions (Signed)
Take the medicine Antivert as directed for the dizziness.  Work note provided to be out of work for a week.  Return for any new or worse symptoms or if not improving.

## 2020-08-07 NOTE — ED Notes (Signed)
Pt states nausea and dizziness is a lot less

## 2020-08-07 NOTE — ED Triage Notes (Signed)
Dizziness and nausea , weakness x 3 days  Denies vomiting, no diarrhea

## 2021-10-21 ENCOUNTER — Emergency Department (HOSPITAL_BASED_OUTPATIENT_CLINIC_OR_DEPARTMENT_OTHER)
Admission: EM | Admit: 2021-10-21 | Discharge: 2021-10-21 | Disposition: A | Discharge status: Home / Self Care | Payer: Medicaid Other | Attending: Emergency Medicine | Admitting: Emergency Medicine

## 2021-10-21 ENCOUNTER — Other Ambulatory Visit (HOSPITAL_BASED_OUTPATIENT_CLINIC_OR_DEPARTMENT_OTHER): Payer: Self-pay

## 2021-10-21 ENCOUNTER — Encounter (HOSPITAL_BASED_OUTPATIENT_CLINIC_OR_DEPARTMENT_OTHER): Payer: Self-pay

## 2021-10-21 ENCOUNTER — Other Ambulatory Visit: Payer: Self-pay

## 2021-10-21 DIAGNOSIS — R300 Dysuria: Secondary | ICD-10-CM | POA: Diagnosis present

## 2021-10-21 DIAGNOSIS — N3000 Acute cystitis without hematuria: Secondary | ICD-10-CM | POA: Insufficient documentation

## 2021-10-21 LAB — URINALYSIS, ROUTINE W REFLEX MICROSCOPIC
Bilirubin Urine: NEGATIVE
Glucose, UA: NEGATIVE mg/dL
Ketones, ur: NEGATIVE mg/dL
Nitrite: NEGATIVE
Protein, ur: 30 mg/dL — AB
Specific Gravity, Urine: >1.03 — ABNORMAL HIGH (ref 1.005–1.030)
pH: 6 (ref 5.0–8.0)

## 2021-10-21 LAB — URINALYSIS, MICROSCOPIC (REFLEX)

## 2021-10-21 LAB — PREGNANCY, URINE: Preg Test, Ur: NEGATIVE

## 2021-10-21 MED ORDER — CEPHALEXIN 500 MG PO CAPS
500.0000 mg | ORAL_CAPSULE | Freq: Four times a day (QID) | ORAL | 0 refills | Status: DC
  Filled 2021-10-21: qty 20, 5d supply, fill #0

## 2021-10-21 NOTE — ED Triage Notes (Signed)
Pt c/o dysuria, freq-started 2 days ago-also c/o rash to back of neck-NAD-steady gait

## 2021-10-21 NOTE — ED Provider Notes (Signed)
[facility] EMERGENCY DEPARTMENT Provider Note   CSN: [id number] Arrival date & time: 10/21/21  1223     History  Chief Complaint  Patient presents with   Dysuria    Destiny Morrow is a 36 y.o. female presenting with a concern for UTI.  Reports that over the past 3 days she has developed dysuria, no hematuria.  Reports that she drinks a lot of caffeine and soda and thinks that maybe she needs to drink more water.  Sexually active and reports only occasionally urinating post intercourse.  Denies any abdominal or back pain.  No fevers or chills.  No vaginal discomfort or discharge.  Last menstrual period ended on 1/11.   Dysuria     Home Medications Prior to Admission medications   Medication Sig Start Date End Date Taking? Authorizing Provider  Multiple Vitamins-Minerals (ONE-A-DAY WOMENS PO) Take by mouth.   Yes [provider]  acetaminophen (TYLENOL) 500 MG tablet Take 1,000 mg by mouth every 6 (six) hours as needed for pain.    [provider]  Cyanocobalamin (VITAMIN B 12 PO) Take by mouth.    [provider]  meloxicam (MOBIC) 7.5 MG tablet Take 1 tablet (7.5 mg total) by mouth daily. 09/23/19   Law, Bea Graff, PA-C  mirtazapine (REMERON) 15 MG tablet Take 15 mg by mouth at bedtime.    [provider]      Allergies    Patient has no known allergies.    Review of Systems   Review of Systems  Genitourinary:  Positive for dysuria.  Per HPI Physical Exam Updated Vital Signs BP 124/81 (BP Location: Left Arm)    Pulse 73    Temp 98.8 F (37.1 C) (Oral)    Resp 18    LMP 09/08/2021    SpO2 100%  Physical Exam Vitals and nursing note reviewed.  Constitutional:      Appearance: Normal appearance.  HENT:     Head: Normocephalic and atraumatic.  Eyes:     General: No scleral icterus.    Conjunctiva/sclera: Conjunctivae normal.  Pulmonary:     Effort: Pulmonary effort is normal. No respiratory distress.  Abdominal:      General: There is no distension.     Tenderness: There is no abdominal tenderness. There is no right CVA tenderness or left CVA tenderness.  Skin:    Findings: No rash.  Neurological:     Mental Status: She is alert.  Psychiatric:        Mood and Affect: Mood normal.    ED Results / Procedures / Treatments   Labs (all labs ordered are listed, but only abnormal results are displayed) Labs Reviewed  PREGNANCY, URINE  URINALYSIS, ROUTINE W REFLEX MICROSCOPIC    EKG None  Radiology No results found.  Procedures Procedures    Medications Ordered in ED Medications - No data to display  ED Course/ Medical Decision Making/ A&P                           Medical Decision Making Amount and/or Complexity of Data Reviewed Labs: ordered.  Risk Prescription drug management.   36 year old female concern for urinary tract infection.  I ordered a urinalysis which revealed a UTI.  Keflex sent to the pharmacy.  She was given information about UTIs to decrease her likelihood of getting another one.  Stable for discharge at this time.   Final Clinical Impression(s) / ED Diagnoses Final  diagnoses:  Acute cystitis without hematuria    Rx / DC Orders Results and diagnoses were explained to the patient. Return precautions discussed in full. Patient had no additional questions and expressed complete understanding.   This chart was dictated using voice recognition software.  Despite best efforts to proofread,  errors can occur which can change the documentation meaning.    Rhae Hammock, PA-C 10/21/21 1430    Margette Fast, MD 10/28/21 2016

## 2021-10-21 NOTE — Discharge Instructions (Addendum)
You do have a urinary tract infection today.  Antibiotics have been sent to your pharmacy.  Please remember to urinate after sexual activity.  Information about UTIs is attached to these discharge papers.

## 2022-03-29 ENCOUNTER — Emergency Department (HOSPITAL_BASED_OUTPATIENT_CLINIC_OR_DEPARTMENT_OTHER): Payer: Medicaid Other

## 2022-03-29 ENCOUNTER — Encounter (HOSPITAL_BASED_OUTPATIENT_CLINIC_OR_DEPARTMENT_OTHER): Payer: Self-pay | Admitting: Emergency Medicine

## 2022-03-29 ENCOUNTER — Emergency Department (HOSPITAL_BASED_OUTPATIENT_CLINIC_OR_DEPARTMENT_OTHER)
Admission: EM | Admit: 2022-03-29 | Discharge: 2022-03-29 | Disposition: A | Discharge status: Home / Self Care | Payer: Medicaid Other | Attending: Emergency Medicine | Admitting: Emergency Medicine

## 2022-03-29 DIAGNOSIS — M546 Pain in thoracic spine: Secondary | ICD-10-CM | POA: Insufficient documentation

## 2022-03-29 DIAGNOSIS — Z87442 Personal history of urinary calculi: Secondary | ICD-10-CM | POA: Insufficient documentation

## 2022-03-29 DIAGNOSIS — R059 Cough, unspecified: Secondary | ICD-10-CM | POA: Diagnosis not present

## 2022-03-29 MED ORDER — LIDOCAINE 5 % EX PTCH
1.0000 | MEDICATED_PATCH | Freq: Once | CUTANEOUS | Status: DC
  Administered 2022-03-29: 1 via TRANSDERMAL
  Filled 2022-03-29: qty 1

## 2022-03-29 MED ORDER — KETOROLAC TROMETHAMINE 30 MG/ML IJ SOLN
15.0000 mg | Freq: Once | INTRAMUSCULAR | Status: AC
  Administered 2022-03-29: 15 mg via INTRAMUSCULAR
  Filled 2022-03-29: qty 1

## 2022-03-29 NOTE — ED Triage Notes (Signed)
Pt reports right upper back pain that started Saturday. Pain worsens with talking and taking deep breaths. Also endorses cough x 2 weeks. Denies cp.

## 2022-03-29 NOTE — ED Provider Notes (Signed)
[facility] EMERGENCY DEPARTMENT Provider Note   CSN: [id number] Arrival date & time: 03/29/22  1527     History  Chief Complaint  Patient presents with   Back Pain   Cough    Destiny Morrow is a 36 y.o. female.  Destiny Morrow is a 36 y.o. female with a history of kidney stone, and anxiety, who presents to the ED for evaluation of pain in the right upper back that started about a week ago.  She reports pain is worse with movement, deep breath, and talking.  She also reports that she has had a cough for 2 weeks.  Denies chest pain.  No shortness of breath.  No lightheadedness or syncope.  Patient was seen at urgent care yesterday and prescribed antibiotics and steroids for bronchitis versus pneumonia but they were unable to perform a chest x-ray there and recommended that she follow-up.  No fevers or chills.  No other aggravating or relieving factors.  No recent travel or surgery, no history of PE or DVT.  Not on any estrogen containing birth control.  The history is provided by the patient.  Back Pain Associated symptoms: no chest pain and no fever   Cough Associated symptoms: no chest pain, no chills, no fever and no shortness of breath        Home Medications Prior to Admission medications   Medication Sig Start Date End Date Taking? Authorizing Provider  acetaminophen (TYLENOL) 500 MG tablet Take 1,000 mg by mouth every 6 (six) hours as needed for pain.    [provider]  cephALEXin (KEFLEX) 500 MG capsule Take 1 capsule (500 mg total) by mouth 4 (four) times daily. 10/21/21   Redwine, Madison A, PA-C  Cyanocobalamin (VITAMIN B 12 PO) Take by mouth.    [provider]  meloxicam (MOBIC) 7.5 MG tablet Take 1 tablet (7.5 mg total) by mouth daily. 09/23/19   Law, Waylan Boga, PA-C  mirtazapine (REMERON) 15 MG tablet Take 15 mg by mouth at bedtime.    [provider]  Multiple Vitamins-Minerals (ONE-A-DAY WOMENS PO) Take by mouth.    [provider]      Allergies    Patient has no known allergies.    Review of Systems   Review of Systems  Constitutional:  Negative for chills and fever.  Respiratory:  Positive for cough. Negative for shortness of breath.   Cardiovascular:  Negative for chest pain.  Musculoskeletal:  Positive for back pain.    Physical Exam Updated Vital Signs BP 117/75 (BP Location: Left Arm)   Pulse 72   Temp 98.6 F (37 C) (Oral)   Resp 16   LMP 03/16/2022 (Exact Date)   SpO2 100%  Physical Exam Vitals and nursing note reviewed.  Constitutional:      General: She is not in acute distress.    Appearance: Normal appearance. She is well-developed. She is not ill-appearing or diaphoretic.  HENT:     Head: Normocephalic and atraumatic.  Eyes:     General:        Right eye: No discharge.        Left eye: No discharge.  Cardiovascular:     Rate and Rhythm: Normal rate and regular rhythm.     Heart sounds: Normal heart sounds.  Pulmonary:     Effort: Pulmonary effort is normal. No respiratory distress.     Breath sounds: Normal breath sounds.     Comments: Respirations equal and unlabored, patient able  to speak in full sentences, lungs clear to auscultation bilaterally Chest:     Chest wall: No tenderness.  Abdominal:     General: There is no distension.     Palpations: Abdomen is soft.     Tenderness: There is no abdominal tenderness.  Musculoskeletal:     Comments: Tenderness over the right upper back over the scapula without overlying skin changes or palpable deformity, pain reproducible with palpation.  Neurological:     Mental Status: She is alert and oriented to person, place, and time.     Coordination: Coordination normal.  Psychiatric:        Mood and Affect: Mood normal.        Behavior: Behavior normal.     ED Results / Procedures / Treatments   Labs (all labs ordered are listed, but only abnormal results are displayed) Labs Reviewed - No data to  display  EKG None  Radiology DG Chest 2 View  Result Date: 03/29/2022 CLINICAL DATA:  RIGHT upper back pain since Saturday, worsened pain with deep breathing EXAM: CHEST - 2 VIEW COMPARISON:  None FINDINGS: Normal heart size, mediastinal contours, and pulmonary vascularity. Lungs clear. No pulmonary infiltrate, pleural effusion, or pneumothorax. Mild biconvex thoracic scoliosis. IMPRESSION: No acute abnormalities. Mild biconvex thoracic scoliosis. Electronically Signed   By: Ulyses Southward M.D.   On: 03/29/2022 16:00    Procedures Procedures    Medications Ordered in ED Medications  lidocaine (LIDODERM) 5 % 1 patch (1 patch Transdermal Patch Applied 03/29/22 1619)  ketorolac (TORADOL) 30 MG/ML injection 15 mg (15 mg Intramuscular Given 03/29/22 1620)    ED Course/ Medical Decision Making/ A&P                           Medical Decision Making Amount and/or Complexity of Data Reviewed Radiology: ordered.  Risk Prescription drug management.   36 y.o. female presents to the ED with complaints of right upper back pain, this involves an extensive number of treatment options, and is a complaint that carries with it a high risk of complications and morbidity.  The differential diagnosis includes musculoskeletal pain, muscle cramp or spasm, pneumonia, PE  On arrival pt is nontoxic, vitals WNL. Exam significant for reproducible tenderness over the right upper back, clear lungs   Previous records obtained and reviewed , unable to review urgent care encounter from yesterday.  I ordered medication including Toradol and Lidoderm patch for back pain   Imaging Studies ordered:  I ordered imaging studies which included chest x-ray, I independently visualized and interpreted imaging which showed no pneumonia or other active cardiopulmonary disease, chronic scoliosis noted  ED Course:   Patient's evaluation today has been reassuring, pain significantly improved with treatment here in the ED.  No  evidence of pneumonia.  I did consider PE, but patient is low risk Wells and PERC negative, so have very low suspicion for this.  Suspect muscle spasm.  Recommend continued supportive treatment.  Patient was placed on antibiotics and prednisone for potential bronchitis versus pneumonia yesterday which she can continue taking.  Patient expresses understanding and agreement with this plan.  Discharged home in good condition.  At this time there does not appear to be any evidence of an acute emergency medical condition requiring further emergent evaluation and the patient appears stable for discharge with appropriate outpatient follow up. Diagnosis and return precautions discussed with patient who verbalizes understanding and is agreeable to discharge.  Portions of this note were generated with Scientist, clinical (histocompatibility and immunogenetics). Dictation errors may occur despite best attempts at proofreading.         Final Clinical Impression(s) / ED Diagnoses Final diagnoses:  Acute right-sided thoracic back pain    Rx / DC Orders ED Discharge Orders     None         Legrand Rams 04/08/22 7341    Charlynne Pander, MD 04/09/22 1201

## 2022-03-29 NOTE — Discharge Instructions (Addendum)
I suspect her back is musculoskeletal in nature, could be due to muscle spasm initially from coughing.  No evidence of rib fracture or pneumonia on your chest x-ray today.  Given ongoing cough you may have bronchitis and can continue the medications you are prescribed by urgent care yesterday.  To help treat pain in your back you can use over-the-counter Salonpas lidocaine patches, you can also take Tylenol 1000 mg and ibuprofen 600 mg to treat pain.  You can also use a tennis ball of over the area up against a wall to hold massage to the area.  Follow-up with your primary care doctor if symptoms or not improving.  If you develop chest pain, shortness of breath, fever or worsening pain return for reevaluation.

## 2022-04-15 ENCOUNTER — Encounter: Payer: Self-pay | Admitting: Family

## 2022-04-15 ENCOUNTER — Ambulatory Visit (INDEPENDENT_AMBULATORY_CARE_PROVIDER_SITE_OTHER): Payer: Medicaid Other | Admitting: Family

## 2022-04-15 VITALS — BP 118/74 | HR 70 | Temp 98.5°F | Resp 16 | Ht 66.0 in | Wt 125.0 lb

## 2022-04-15 DIAGNOSIS — F419 Anxiety disorder, unspecified: Secondary | ICD-10-CM | POA: Insufficient documentation

## 2022-04-15 DIAGNOSIS — E538 Deficiency of other specified B group vitamins: Secondary | ICD-10-CM | POA: Diagnosis not present

## 2022-04-15 DIAGNOSIS — Z23 Encounter for immunization: Secondary | ICD-10-CM | POA: Diagnosis not present

## 2022-04-15 DIAGNOSIS — E559 Vitamin D deficiency, unspecified: Secondary | ICD-10-CM

## 2022-04-15 DIAGNOSIS — Z13220 Encounter for screening for lipoid disorders: Secondary | ICD-10-CM

## 2022-04-15 DIAGNOSIS — Z0000 Encounter for general adult medical examination without abnormal findings: Secondary | ICD-10-CM | POA: Diagnosis not present

## 2022-04-15 DIAGNOSIS — R7989 Other specified abnormal findings of blood chemistry: Secondary | ICD-10-CM

## 2022-04-15 NOTE — Addendum Note (Signed)
Addended by: Kathi Ludwig on: 04/15/2022 02:57 PM   Modules accepted: Orders

## 2022-04-15 NOTE — Progress Notes (Signed)
Destiny Morrow is a 36 y.o. female with the following history as recorded in [employer]:  Patient Active Problem List   Diagnosis Date Noted   Anxiety 04/15/2022   Vitamin D deficiency 04/15/2022    Current Outpatient Medications  Medication Sig Dispense Refill   Multiple Vitamins-Minerals (ONE-A-DAY WOMENS PO) Take by mouth.     No current facility-administered medications for this visit.    Allergies: Patient has no known allergies.  Past Medical History:  Diagnosis Date   Kidney stone    Panic attacks    UTI (lower urinary tract infection)     Past Surgical History:  Procedure Laterality Date   KIDNEY STONE SURGERY      No family history on file.  Social History   Tobacco Use   Smoking status: Every Day    Packs/day: 0.50    Types: Cigarettes   Smokeless tobacco: Never  Substance Use Topics   Alcohol use: Yes    Comment: occ    Subjective:   Presents today as a new patient; teenage daughter is present in the exam;  Does have GYN- Dr. Jac Canavan- planning to follow up soon; LMP- now ? Hyperthyroidism- was told by GYN that thyroid was underactive in the past; does not remember seeing endocrinology; Has been told Vitamin D and Vitamin B12 has been low in the past; + history of anxiety- may want to consider re-starting medication.  + smoker- not ready to quit;   Review of Systems  Constitutional: Negative.   HENT: Negative.    Eyes: Negative.   Respiratory: Negative.    Cardiovascular: Negative.   Gastrointestinal: Negative.   Genitourinary: Negative.   Musculoskeletal: Negative.   Skin: Negative.   Neurological: Negative.   Endo/Heme/Allergies: Negative.   Psychiatric/Behavioral:  The patient is nervous/anxious.      Objective:  Vitals:   04/15/22 1359  BP: 118/74  Pulse: 70  Resp: 16  Temp: 98.5 F (36.9 C)  TempSrc: Oral  SpO2: 98%  Weight: 125 lb (56.7 kg)  Height: 5' 6"  (1.676 m)    General: Well developed, well nourished, in no acute  distress  Skin : Warm and dry.  Head: Normocephalic and atraumatic  Eyes: Sclera and conjunctiva clear; pupils round and reactive to light; extraocular movements intact  Ears: External normal; canals clear; tympanic membranes normal  Oropharynx: Pink, supple. No suspicious lesions  Neck: Supple without thyromegaly, adenopathy  Lungs: Respirations unlabored; clear to auscultation bilaterally without wheeze, rales, rhonchi  CVS exam: normal rate and regular rhythm.  Abdomen: Soft; nontender; nondistended; normoactive bowel sounds; no masses or hepatosplenomegaly  Musculoskeletal: No deformities; no active joint inflammation  Extremities: No edema, cyanosis, clubbing  Vessels: Symmetric bilaterally  Neurologic: Alert and oriented; speech intact; face symmetrical; moves all extremities well; CNII-XII intact without focal deficit  Assessment:  1. PE (physical exam), annual   2. Lipid screening   3. Abnormal TSH   4. Vitamin D deficiency   5. Low vitamin B12 level     Plan:  Age appropriate preventive healthcare needs addressed; encouraged regular eye doctor and dental exams; encouraged regular exercise; will update labs and refills as needed today; follow-up to be determined; Stressed need to quit smoking and follow up with her GYN- she will schedule appointment and is considering options to quit smoking;  Tdap updated;   No follow-ups on file.  Orders Placed This Encounter  Procedures   CBC with Differential/Platelet   Comp Met (CMET)   Lipid panel  Thyroid Panel With TSH   Vitamin D (25 hydroxy)   B12    Requested Prescriptions    No prescriptions requested or ordered in this encounter

## 2022-04-16 LAB — COMPREHENSIVE METABOLIC PANEL
ALT: 12 U/L (ref 0–35)
AST: 19 U/L (ref 0–37)
Albumin: 4.5 g/dL (ref 3.5–5.2)
Alkaline Phosphatase: 36 U/L — ABNORMAL LOW (ref 39–117)
BUN: 11 mg/dL (ref 6–23)
CO2: 24 mEq/L (ref 19–32)
Calcium: 9.5 mg/dL (ref 8.4–10.5)
Chloride: 104 mEq/L (ref 96–112)
Creatinine, Ser: 0.84 mg/dL (ref 0.40–1.20)
GFR: 88.43 mL/min (ref >60.00)
Glucose, Bld: 75 mg/dL (ref 70–99)
Potassium: 4.3 mEq/L (ref 3.5–5.1)
Sodium: 137 mEq/L (ref 135–145)
Total Bilirubin: 1.2 mg/dL (ref 0.2–1.2)
Total Protein: 6.9 g/dL (ref 6.0–8.3)

## 2022-04-16 LAB — CBC WITH DIFFERENTIAL/PLATELET
Basophils Absolute: 0.1 10*3/uL (ref 0.0–0.1)
Basophils Relative: 0.8 % (ref 0.0–3.0)
Eosinophils Absolute: 0.1 10*3/uL (ref 0.0–0.7)
Eosinophils Relative: 1.5 % (ref 0.0–5.0)
HCT: 36.8 % (ref 36.0–46.0)
Hemoglobin: 12 g/dL (ref 12.0–15.0)
Lymphocytes Relative: 13 % (ref 12.0–46.0)
Lymphs Abs: 1.2 10*3/uL (ref 0.7–4.0)
MCHC: 32.5 g/dL (ref 30.0–36.0)
MCV: 92.5 fl (ref 78.0–100.0)
Monocytes Absolute: 0.6 10*3/uL (ref 0.1–1.0)
Monocytes Relative: 6.4 % (ref 3.0–12.0)
Neutro Abs: 7.2 10*3/uL (ref 1.4–7.7)
Neutrophils Relative %: 78.3 % — ABNORMAL HIGH (ref 43.0–77.0)
Platelets: 337 10*3/uL (ref 150.0–400.0)
RBC: 3.98 Mil/uL (ref 3.87–5.11)
RDW: 14 % (ref 11.5–15.5)
WBC: 9.2 10*3/uL (ref 4.0–10.5)

## 2022-04-16 LAB — LIPID PANEL
Cholesterol: 179 mg/dL (ref 0–200)
HDL: 67.1 mg/dL (ref >39.00)
LDL Cholesterol: 101 mg/dL — ABNORMAL HIGH (ref 0–99)
NonHDL: 112.38
Total CHOL/HDL Ratio: 3
Triglycerides: 58 mg/dL (ref 0.0–149.0)
VLDL: 11.6 mg/dL (ref 0.0–40.0)

## 2022-04-16 LAB — THYROID PANEL WITH TSH
Free Thyroxine Index: 2.6 (ref 1.4–3.8)
T3 Uptake: 32 % (ref 22–35)
T4, Total: 8.1 ug/dL (ref 5.1–11.9)
TSH: 0.53 mIU/L

## 2022-04-16 LAB — VITAMIN D 25 HYDROXY (VIT D DEFICIENCY, FRACTURES): VITD: 15.05 ng/mL — ABNORMAL LOW (ref 30.00–100.00)

## 2022-04-16 LAB — VITAMIN B12: Vitamin B-12: 264 pg/mL (ref 211–911)

## 2022-04-19 ENCOUNTER — Other Ambulatory Visit: Payer: Self-pay | Admitting: Family

## 2022-04-19 ENCOUNTER — Other Ambulatory Visit (HOSPITAL_BASED_OUTPATIENT_CLINIC_OR_DEPARTMENT_OTHER): Payer: Self-pay

## 2022-04-19 MED ORDER — VITAMIN D (ERGOCALCIFEROL) 1.25 MG (50000 UNIT) PO CAPS
50000.0000 [IU] | ORAL_CAPSULE | ORAL | 0 refills | Status: AC
  Filled 2022-04-19: qty 12, 84d supply, fill #0

## 2022-04-27 ENCOUNTER — Other Ambulatory Visit (HOSPITAL_BASED_OUTPATIENT_CLINIC_OR_DEPARTMENT_OTHER): Payer: Self-pay

## 2022-05-30 ENCOUNTER — Encounter (HOSPITAL_BASED_OUTPATIENT_CLINIC_OR_DEPARTMENT_OTHER): Payer: Self-pay | Admitting: Emergency Medicine

## 2022-05-30 ENCOUNTER — Other Ambulatory Visit: Payer: Self-pay

## 2022-05-30 ENCOUNTER — Emergency Department (HOSPITAL_BASED_OUTPATIENT_CLINIC_OR_DEPARTMENT_OTHER)
Admission: EM | Admit: 2022-05-30 | Discharge: 2022-05-30 | Disposition: A | Discharge status: Home / Self Care | Payer: Medicaid Other | Attending: Emergency Medicine | Admitting: Emergency Medicine

## 2022-05-30 DIAGNOSIS — R519 Headache, unspecified: Secondary | ICD-10-CM | POA: Diagnosis present

## 2022-05-30 DIAGNOSIS — U071 COVID-19: Secondary | ICD-10-CM | POA: Diagnosis not present

## 2022-05-30 LAB — SARS CORONAVIRUS 2 BY RT PCR: SARS Coronavirus 2 by RT PCR: POSITIVE — AB

## 2022-05-30 NOTE — Discharge Instructions (Addendum)
Return if any problems.  Tylenol or ibuprofen for fever and body aches.

## 2022-05-30 NOTE — ED Triage Notes (Signed)
Patient states that she has had a headache dizziness and vomiting since Friday.States that she has had a fever. Vomit x1, States she has nausea

## 2022-05-30 NOTE — ED Notes (Signed)
Patient states that she has vomited x1 this am. States that she is nauseous. Reports some dizziness. States that she coughed up a lot of phlegm yesterday.

## 2022-05-30 NOTE — ED Provider Notes (Signed)
[facility] EMERGENCY DEPARTMENT Provider Note   CSN: [id number] Arrival date & time: 05/30/22  0901     History  Chief Complaint  Patient presents with   Headache    Dizziness vomiting    Destiny Morrow is a 36 y.o. female.  Patient complains of cough and congestion she complains of body aches.  Patient reports she has vomited once  The history is provided by the patient. No language interpreter was used.  Headache Pain location:  Generalized Radiates to:  Does not radiate Duration:  2 days Timing:  Constant Chronicity:  New Context: coughing   Relieved by:  Nothing      Home Medications Prior to Admission medications   Medication Sig Start Date End Date Taking? Authorizing Provider  Vitamin D, Ergocalciferol, (DRISDOL) 1.25 MG (50000 UNIT) CAPS capsule Take 1 capsule (50,000 Units total) by mouth every 7 (seven) days for 12 doses. 04/19/22 07/21/22  Olive Bass, FNP  Multiple Vitamins-Minerals (ONE-A-DAY WOMENS PO) Take by mouth.    [provider]      Allergies    Patient has no known allergies.    Review of Systems   Review of Systems  Neurological:  Positive for headaches.  All other systems reviewed and are negative.   Physical Exam Updated Vital Signs BP 113/75 (BP Location: Right Arm)   Pulse 67   Temp 98.5 F (36.9 C) (Oral)   Resp 16   LMP 05/11/2022 Comment: 8-12  SpO2 100%  Physical Exam Vitals and nursing note reviewed.  Constitutional:      Appearance: She is well-developed.  HENT:     Head: Normocephalic.  Eyes:     Extraocular Movements: Extraocular movements intact.  Cardiovascular:     Rate and Rhythm: Normal rate.  Pulmonary:     Effort: Pulmonary effort is normal.  Abdominal:     General: There is no distension.  Musculoskeletal:        General: Normal range of motion.     Cervical back: Normal range of motion.  Skin:    General: Skin is warm.  Neurological:     General: No focal deficit  present.     Mental Status: She is alert and oriented to person, place, and time.  Psychiatric:        Mood and Affect: Mood normal.     ED Results / Procedures / Treatments   Labs (all labs ordered are listed, but only abnormal results are displayed) Labs Reviewed  SARS CORONAVIRUS 2 BY RT PCR - Abnormal; Notable for the following components:      Result Value   SARS Coronavirus 2 by RT PCR POSITIVE (*)    All other components within normal limits    EKG None  Radiology No results found.  Procedures Procedures    Medications Ordered in ED Medications - No data to display  ED Course/ Medical Decision Making/ A&P                           Medical Decision Making Patient complains of cough and congestion fever and body aches  Amount and/or Complexity of Data Reviewed Labs: ordered. Decision-making details documented in ED Course.    Details: Labs ordered reviewed and interpreted patient has a positive COVID test  Risk OTC drugs. Risk Details: Patient counseled on COVID and quarantining.  Is advised Tylenol or ibuprofen for symptoms patient encouraged to drink fluids she is advised to  return if any problems           Final Clinical Impression(s) / ED Diagnoses Final diagnoses:  COVID    Rx / DC Orders ED Discharge Orders     None      An After Visit Summary was printed and given to the patient.    Elson Areas, PA-C 05/30/22 1051    Melene Plan, DO 05/30/22 1125

## 2022-06-02 ENCOUNTER — Encounter: Payer: Self-pay | Admitting: Family Medicine

## 2022-06-02 ENCOUNTER — Telehealth (INDEPENDENT_AMBULATORY_CARE_PROVIDER_SITE_OTHER): Payer: Medicaid Other | Admitting: Family Medicine

## 2022-06-02 DIAGNOSIS — U071 COVID-19: Secondary | ICD-10-CM | POA: Diagnosis not present

## 2022-06-02 MED ORDER — ONDANSETRON 4 MG PO TBDP: 4.0000 mg | ORAL_TABLET | Freq: Three times a day (TID) | ORAL | 0 refills | Status: AC | PRN

## 2022-06-02 NOTE — Progress Notes (Signed)
Chief Complaint  Patient presents with   Covid Positive    Tested positive on Sunday 05/30/2022. Light headed and dizzy. Some nausea    [name] here for URI complaints. Due to COVID-19 pandemic, we are interacting via web portal for an electronic face-to-face visit. I verified patient's ID using 2 identifiers. Patient agreed to proceed with visit via this method. Patient is at home, I am at office. Patient and I are present for visit.   Duration: 5 days  Associated symptoms: Fever (102 F max), myalgia, and N/V/D, dizzy/lightheaded Denies: sinus congestion, sinus pain, rhinorrhea, itchy watery eyes, ear pain, ear drainage, sore throat, wheezing, shortness of breath, and abd pain Treatment to date: Tylenol, Nyquil, Dayquil Sick contacts: no obvious s/s's +covid test on 8/27.  Past Medical History:  Diagnosis Date   Kidney stone    Panic attacks    UTI (lower urinary tract infection)     Objective LMP 05/11/2022 Comment: 8-12 No conversational dyspnea Age appropriate judgment and insight Nml affect and mood  COVID-19 - Plan: ondansetron (ZOFRAN-ODT) 4 MG disintegrating tablet  Continue to push fluids w electrolytes, practice good hand hygiene, cover mouth when coughing. Discussed CDC quarantining guidelines.  F/u prn. If starting to experience irreplaceable fluid loss, shaking, or shortness of breath, seek immediate care. Pt voiced understanding and agreement to the plan.  Jilda Roche Weston, DO 06/02/22 9:35 AM

## 2023-04-19 ENCOUNTER — Encounter: Payer: BC Managed Care – PPO | Admitting: Family
